# Patient Record
Sex: Female | Born: 1935 | Race: Black or African American | Hispanic: No | Smoking: Never smoker
Health system: Southern US, Community
[De-identification: ages and names within clinical notes are randomized; demographics above are authoritative.]

## PROBLEM LIST (undated history)

## (undated) DIAGNOSIS — H409 Unspecified glaucoma: Secondary | ICD-10-CM

## (undated) DIAGNOSIS — H269 Unspecified cataract: Secondary | ICD-10-CM

## (undated) DIAGNOSIS — F0390 Unspecified dementia without behavioral disturbance: Secondary | ICD-10-CM

## (undated) DIAGNOSIS — M1990 Unspecified osteoarthritis, unspecified site: Secondary | ICD-10-CM

## (undated) DIAGNOSIS — G5600 Carpal tunnel syndrome, unspecified upper limb: Secondary | ICD-10-CM

## (undated) DIAGNOSIS — I10 Essential (primary) hypertension: Secondary | ICD-10-CM

## (undated) DIAGNOSIS — M4800 Spinal stenosis, site unspecified: Secondary | ICD-10-CM

## (undated) DIAGNOSIS — F03A0 Unspecified dementia, mild, without behavioral disturbance, psychotic disturbance, mood disturbance, and anxiety: Secondary | ICD-10-CM

## (undated) DIAGNOSIS — E7800 Pure hypercholesterolemia, unspecified: Secondary | ICD-10-CM

## (undated) DIAGNOSIS — E0590 Thyrotoxicosis, unspecified without thyrotoxic crisis or storm: Secondary | ICD-10-CM

## (undated) DIAGNOSIS — Z87440 Personal history of urinary (tract) infections: Secondary | ICD-10-CM

## (undated) HISTORY — DX: Carpal tunnel syndrome, unspecified upper limb: G56.00

## (undated) HISTORY — DX: Unspecified dementia, mild, without behavioral disturbance, psychotic disturbance, mood disturbance, and anxiety: F03.A0

## (undated) HISTORY — DX: Unspecified osteoarthritis, unspecified site: M19.90

## (undated) HISTORY — DX: Unspecified dementia without behavioral disturbance: F03.90

## (undated) HISTORY — DX: Unspecified glaucoma: H40.9

## (undated) HISTORY — DX: Personal history of urinary (tract) infections: Z87.440

## (undated) HISTORY — DX: Thyrotoxicosis, unspecified without thyrotoxic crisis or storm: E05.90

## (undated) HISTORY — DX: Essential (primary) hypertension: I10

## (undated) HISTORY — DX: Unspecified cataract: H26.9

## (undated) HISTORY — DX: Spinal stenosis, site unspecified: M48.00

## (undated) HISTORY — PX: CARPAL TUNNEL RELEASE: SHX101

## (undated) HISTORY — DX: Pure hypercholesterolemia, unspecified: E78.00

---

## 1998-03-18 HISTORY — PX: CATARACT EXTRACTION: SUR2

## 2014-01-11 ENCOUNTER — Ambulatory Visit (INDEPENDENT_AMBULATORY_CARE_PROVIDER_SITE_OTHER): Payer: Federal, State, Local not specified - PPO | Admitting: Internal Medicine

## 2014-01-11 ENCOUNTER — Encounter: Payer: Self-pay | Admitting: Internal Medicine

## 2014-01-11 VITALS — BP 130/60 | HR 65 | Temp 97.6°F

## 2014-01-11 DIAGNOSIS — I739 Peripheral vascular disease, unspecified: Secondary | ICD-10-CM

## 2014-01-11 DIAGNOSIS — R8290 Unspecified abnormal findings in urine: Secondary | ICD-10-CM

## 2014-01-11 DIAGNOSIS — M179 Osteoarthritis of knee, unspecified: Secondary | ICD-10-CM

## 2014-01-11 DIAGNOSIS — N390 Urinary tract infection, site not specified: Secondary | ICD-10-CM

## 2014-01-11 DIAGNOSIS — R82998 Other abnormal findings in urine: Secondary | ICD-10-CM

## 2014-01-11 DIAGNOSIS — K5900 Constipation, unspecified: Secondary | ICD-10-CM

## 2014-01-11 DIAGNOSIS — E0590 Thyrotoxicosis, unspecified without thyrotoxic crisis or storm: Secondary | ICD-10-CM

## 2014-01-11 DIAGNOSIS — R6889 Other general symptoms and signs: Secondary | ICD-10-CM

## 2014-01-11 DIAGNOSIS — IMO0002 Reserved for concepts with insufficient information to code with codable children: Secondary | ICD-10-CM

## 2014-01-11 DIAGNOSIS — G4700 Insomnia, unspecified: Secondary | ICD-10-CM

## 2014-01-11 DIAGNOSIS — F0390 Unspecified dementia without behavioral disturbance: Secondary | ICD-10-CM

## 2014-01-11 DIAGNOSIS — H409 Unspecified glaucoma: Secondary | ICD-10-CM

## 2014-01-11 DIAGNOSIS — I10 Essential (primary) hypertension: Secondary | ICD-10-CM

## 2014-01-11 DIAGNOSIS — M1710 Unilateral primary osteoarthritis, unspecified knee: Secondary | ICD-10-CM

## 2014-01-11 DIAGNOSIS — E785 Hyperlipidemia, unspecified: Secondary | ICD-10-CM

## 2014-01-11 DIAGNOSIS — Z23 Encounter for immunization: Secondary | ICD-10-CM

## 2014-01-11 LAB — POCT URINALYSIS DIPSTICK
Bilirubin, UA: NEGATIVE
Glucose, UA: NEGATIVE
Ketones, UA: NEGATIVE
Nitrite, UA: POSITIVE
Protein, UA: 30
Spec Grav, UA: 1.015
Urobilinogen, UA: NEGATIVE
pH, UA: 6

## 2014-01-11 MED ORDER — SULFAMETHOXAZOLE-TMP DS 800-160 MG PO TABS: 1.0000 | ORAL_TABLET | Freq: Two times a day (BID) | ORAL | Status: DC

## 2014-01-11 NOTE — Progress Notes (Signed)
Patient ID: Melanie SilverCassie Parker, female   DOB: 07/14/1923, 80 y.o.   MRN: [id number]    Chief Complaint  Patient presents with  . Establish Care    New patient establish care   . Urinary Frequency    Patient c/o of urinary frequency x couple of weeks   . Immunizations    Discuss if ok for the flu shot   Allergies  Allergen Reactions  . Ivp Dye [Iodinated Diagnostic Agents] Anaphylaxis  . Naprosyn [Naproxen]    HPI 80 y/o female patient is here to establish care. [place] [facility] and has not seen a doctor since then. She lives with Werner LeanAngela Ivery, her caregiver.  She is legally blind for 2 years. She has history of HTN, dementia, hyperthyroidism, OA, insomnia and HLD among others. She was seeing Dr Lovell SheehanKristina Rowe prior to this. No prior records available for review. She has been having increased urinary frequency for few weeks with dysuria. She would like to know if it ok to get flu vaccine.   Review of Systems  Constitutional: Negative for fever, chills, malaise/fatigue and diaphoresis. Has cold intolerance  HENT: Negative for congestion, hearing loss and sore throat.   Eyes: is legally blind in left eye and has some vision in right eye, is s/p cataract surgery  Respiratory: Negative for cough, sputum production, shortness of breath and wheezing.   Cardiovascular: Negative for chest pain, palpitations, orthopnea. Has leg swelling at times  Gastrointestinal: Negative for heartburn, nausea, vomiting, abdominal pain, rectal bleed, diarrhea and constipation.  Genitourinary: has dysuria. Negative for hematuria. Denies back and flank pain  Musculoskeletal: Negative for back pain, falls, myalgias. has multiple joint pain. Left hand carpal unnel syndrome Skin: Negative for itching and rash.  Neurological: Negative for dizziness, tingling, focal weakness and headaches.  Psychiatric/Behavioral: Negative for depression and memory loss. The patient is not nervous/anxious.    Past Medical History  Diagnosis Date  . High blood pressure   . High cholesterol   . Overactive thyroid gland   . Cataract, right   . Mild dementia   . Arthritis   . Spinal stenosis   . Carpal tunnel syndrome   . History of recurrent UTI (urinary tract infection)    Past Surgical History  Procedure Laterality Date  . Carpal tunnel release    . Cataract extraction  2000   Medication reviewed. See MAR  Family History  Problem Relation Age of Onset  . Cancer Brother     Prostate   . Arthritis Mother   . Heart disease Mother   . Heart disease Father   . Lung disease Brother    History   Social History  . Marital Status: Widowed    Spouse Name: N/A    Number of Children: N/A  . Years of Education: N/A   Occupational History  . Not on file.   Social History Main Topics  . Smoking status: Never Smoker   . Smokeless tobacco: Current User    Types: Snuff  . Alcohol Use: No  . Drug Use: No  . Sexual Activity: Not on file   Other Topics Concern  . Not on file   Social History Narrative   Limits caffeine when possible   Lives in an apartment, 1 stories, 3 persons, no pets   Current/past profession- Designer, fashion/clothingederal civil service worker- [place]    No exercise       Physical exam BP 130/60 mmHg  Pulse 65  Temp(Src) 97.6 F (36.4 C) (Oral)  Wt   SpO2 99%  General- elderly female in no acute distress Head- atraumatic, normocephalic Eyes- no pallor, no icterus, no discharge Neck- no lymphadenopathy Cardiovascular- normal s1,s2, no murmurs Respiratory- bilateral clear to auscultation, no wheeze, no rhonchi, no crackles Abdomen- bowel sounds present, soft, non tender, no CVA tenderness Musculoskeletal- able to move all 4 extremities, right leg weakness and limited mobility, on wheelchair, no calf pain, no leg edema  Neurological- no focal deficit Skin- warm and dry Psychiatry- alert and oriented to person, place and time, normal mood and  affect  Assessment/plan  1. Hyperlipidemia Check lipid today. Continue lipitor for no - CMP - Lipid Panel  2. Essential hypertension Stable bp. Continue lisinopril and amlodipine current regimen - CMP  3. Glaucoma Continue her latanoprost.   4. Constipation, unspecified constipation type Continue colace  5. Osteoarthrosis, unspecified whether generalized or localized, involving lower leg Continue her mobic  6. Hyperthyroidism Continue methimazole to get prior records - CBC with Differential - TSH - T3 - T4, Free  7. Cold intolerance Check thyroid panel - TSH - T3 - T4, Free  8. Insomnia Continue her trazodone for now, no dose changes  9. Encounter for immunization Influenza vaccine provided  10. Abnormal urine odor - POC Urinalysis Dipstick - Culture, Urine; Future - Culture, Urine  11. Urine leukocytes Start empirically on bactrim ds bid for now, f/u urine culture report, encouraged hydration - POC Urinalysis Dipstick - Culture, Urine; Future - Culture, Urine

## 2014-01-12 LAB — CBC WITH DIFFERENTIAL/PLATELET
Basophils Absolute: 0.1 10*3/uL (ref 0.0–0.2)
Basos: 2 %
Eos: 2 %
Eosinophils Absolute: 0.2 10*3/uL (ref 0.0–0.4)
HCT: 39.7 % (ref 34.0–46.6)
Hemoglobin: 13.4 g/dL (ref 11.1–15.9)
Immature Grans (Abs): 0 10*3/uL (ref 0.0–0.1)
Immature Granulocytes: 0 %
Lymphocytes Absolute: 2.9 10*3/uL (ref 0.7–3.1)
Lymphs: 36 %
MCH: 29.1 pg (ref 26.6–33.0)
MCHC: 33.8 g/dL (ref 31.5–35.7)
MCV: 86 fL (ref 79–97)
MONOS ABS: 0.6 10*3/uL (ref 0.1–0.9)
Monocytes: 8 %
Neutrophils Absolute: 4.1 10*3/uL (ref 1.4–7.0)
Neutrophils Relative %: 52 %
RBC: 4.61 x10E6/uL (ref 3.77–5.28)
RDW: 14.6 % (ref 12.3–15.4)
WBC: 8 10*3/uL (ref 3.4–10.8)

## 2014-01-12 LAB — TSH: TSH: 1.93 u[IU]/mL (ref 0.450–4.500)

## 2014-01-12 LAB — COMPREHENSIVE METABOLIC PANEL
ALT: 13 IU/L (ref 0–32)
AST: 21 IU/L (ref 0–40)
Albumin/Globulin Ratio: 1.6 (ref 1.1–2.5)
Albumin: 4.2 g/dL (ref 3.5–4.7)
Alkaline Phosphatase: 83 IU/L (ref 39–117)
BILIRUBIN TOTAL: 0.5 mg/dL (ref 0.0–1.2)
BUN/Creatinine Ratio: 18 (ref 11–26)
BUN: 14 mg/dL (ref 8–27)
CO2: 24 mmol/L (ref 18–29)
CREATININE: 0.8 mg/dL (ref 0.57–1.00)
Calcium: 9.5 mg/dL (ref 8.7–10.3)
Chloride: 105 mmol/L (ref 97–108)
GFR calc non Af Amer: 66 mL/min/{1.73_m2} (ref >59)
GFR, EST AFRICAN AMERICAN: 76 mL/min/{1.73_m2} (ref >59)
GLOBULIN, TOTAL: 2.6 g/dL (ref 1.5–4.5)
Glucose: 95 mg/dL (ref 65–99)
POTASSIUM: 4 mmol/L (ref 3.5–5.2)
SODIUM: 145 mmol/L — AB (ref 134–144)
Total Protein: 6.8 g/dL (ref 6.0–8.5)

## 2014-01-12 LAB — T3: T3, Total: 117 ng/dL (ref 71–180)

## 2014-01-12 LAB — LIPID PANEL
Chol/HDL Ratio: 2.5 ratio units (ref 0.0–4.4)
Cholesterol, Total: 154 mg/dL (ref 100–199)
HDL: 61 mg/dL (ref >39)
LDL CALC: 80 mg/dL (ref 0–99)
Triglycerides: 66 mg/dL (ref 0–149)
VLDL CHOLESTEROL CAL: 13 mg/dL (ref 5–40)

## 2014-01-12 LAB — T4, FREE: Free T4: 1.25 ng/dL (ref 0.82–1.77)

## 2014-01-13 LAB — URINE CULTURE

## 2014-01-14 ENCOUNTER — Telehealth: Payer: Self-pay | Admitting: *Deleted

## 2014-01-14 NOTE — Telephone Encounter (Signed)
Spoke with the son regarding the patient's labs, he was informed to stop the antibiotics and to give her plenty of water because her sodium level was a little elevated. He stated that he would push the fluid.

## 2014-01-14 NOTE — Telephone Encounter (Signed)
Message copied by Lamont SnowballICE, Yentl Verge L on Fri Jan 14, 2014  4:17 PM ------      Message from: Oneal GroutPANDEY, MAHIMA      Created: Fri Jan 14, 2014  3:48 PM       Elevated sodium level- encourage hydration. Rest of the lab values normal.  No bacterial growth in urine sample. Can stop antibiotics. ------

## 2014-02-09 ENCOUNTER — Ambulatory Visit (INDEPENDENT_AMBULATORY_CARE_PROVIDER_SITE_OTHER): Payer: Federal, State, Local not specified - PPO | Admitting: Internal Medicine

## 2014-02-09 ENCOUNTER — Encounter: Payer: Self-pay | Admitting: Internal Medicine

## 2014-02-09 VITALS — BP 130/70 | HR 77 | Temp 98.2°F | Resp 18

## 2014-02-09 DIAGNOSIS — I10 Essential (primary) hypertension: Secondary | ICD-10-CM

## 2014-02-09 DIAGNOSIS — I739 Peripheral vascular disease, unspecified: Secondary | ICD-10-CM | POA: Insufficient documentation

## 2014-02-09 DIAGNOSIS — E0590 Thyrotoxicosis, unspecified without thyrotoxic crisis or storm: Secondary | ICD-10-CM

## 2014-02-09 DIAGNOSIS — R32 Unspecified urinary incontinence: Secondary | ICD-10-CM

## 2014-02-09 DIAGNOSIS — F0390 Unspecified dementia without behavioral disturbance: Secondary | ICD-10-CM | POA: Insufficient documentation

## 2014-02-09 MED ORDER — MIRABEGRON ER 25 MG PO TB24: 25.0000 mg | ORAL_TABLET | Freq: Every day | ORAL | Status: DC

## 2014-02-09 NOTE — Progress Notes (Signed)
Patient ID: Melanie Parker, female   DOB: 07/05/1923, 80 y.o.   MRN: [id number]    Chief Complaint  Patient presents with  . Acute Visit   Allergies  Allergen Reactions  . Ivp Dye [Iodinated Diagnostic Agents] Anaphylaxis  . Naprosyn [Naproxen]     HPI 80 y/o female pt is here for acute concern. She has been having trouble with her urination for sometime. She has constant leakage and this is bothering her. She also provides hx of recurrent UTI in past. She has discomfort with urination and feels stinging sensation in her groin area She is legally blind and has dementia. Her son Melanie Parker is her medical representative. He is here with her today Her old recrds are here today. Reviewed and sending necessary ones for scan. Seeing Dr Synetta ShadowHalligan from vein and vascular before, no intervention planned as per prior notes. Updated allergy list  Review of Systems  Constitutional: Negative for fever, chills, diaphoresis. has cold intolerance HENT: Negative for congestion, hearing loss and sore throat.   Eyes: legally blind  Respiratory: Negative for cough, sputum production, shortness of breath and wheezing.   Cardiovascular: Negative for chest pain, palpitations Musculoskeletal: Negative for back pain, on wheelchair Skin: Negative for itching and rash.  Neurological: Negative for dizziness, headaches.  Psychiatric/Behavioral: Negative for depression   Past Medical History  Diagnosis Date  . High blood pressure   . High cholesterol   . Overactive thyroid gland   . Cataract, right   . Mild dementia   . Arthritis   . Spinal stenosis   . Carpal tunnel syndrome   . History of recurrent UTI (urinary tract infection)    Current Outpatient Prescriptions on File Prior to Visit  Medication Sig Dispense Refill  . AMBULATORY NON FORMULARY MEDICATION Medication Name: [employer] Plant Powder, One table spoon q other day    . amLODipine (NORVASC) 10 MG tablet Take 10 mg by mouth daily. For blood  pressure    . atorvastatin (LIPITOR) 10 MG tablet Take 10 mg by mouth daily. For high  cholesterol    . docusate sodium (COLACE) 50 MG capsule Take 50 mg by mouth every other day. For constipation    . latanoprost (XALATAN) 0.005 % ophthalmic solution Place 1 drop into both eyes at bedtime.    Marland Kitchen. lisinopril (PRINIVIL,ZESTRIL) 10 MG tablet Take 10 mg by mouth daily. For blood pressure    . meloxicam (MOBIC) 7.5 MG tablet Take 7.5 mg by mouth daily.    . methimazole (TAPAZOLE) 5 MG tablet Take 5 mg by mouth. Once daily for 5 day's of the week for hyperthyroid    . Omega-3 Fatty Acids (FISH OIL PO) Take by mouth daily.    . traZODone (DESYREL) 50 MG tablet 1/2 by mouth at bedtime     No current facility-administered medications on file prior to visit.    Physical exam BP 130/70 mmHg  Pulse 77  Temp(Src) 98.2 F (36.8 C) (Oral)  Resp 18  SpO2 98%  Recheck 130/70  General- elderly female in no acute distress Head- atraumatic, normocephalic Neck- no lymphadenopathy Mouth- normal mucus membrane Cardiovascular- normal s1,s2, no murmurs Respiratory- bilateral clear to auscultation, no wheeze, no rhonchi, no crackles Abdomen- bowel sounds present, soft, non tender Pelvic exam- has dribbling of urine, no rawness noted but is covered in her urine, no skin breakdown noted Musculoskeletal-  no leg edema, on wheelchair, can stand with assistance but can't walk Neurological- no focal deficit Skin- warm and  dry Psychiatry- alert and oriented  imaging 07/16/11 mammogram benign mammographic findings  labs 09/30/13 u/a- yellow, cloudy, large blood, positive for nitrites, large leukocytes 05/18/13 urine culture proteus miralbilis resistant to cipro, nitrofurantoin, doxycycline  09/30/13 wbc 7.7, hb 13.9, hct 43, mcv 91, plt 169, a1c 5.9, glu 136, na 146, k 4.1, bun 25, cr 0.90, ca 9.5, alb 3.7, tsh 1.48, free t4 2.52  Assessment/plan  1. Urinary incontinence in female - Ambulatory referral to  Urology - Culture, Urine - Urinalysis  2. Essential hypertension Stable on repeat check. Continue amlodipine and lisinopril for now  3. Hyperthyroidism Continue methimazole, reviewed her thyroid panel. Pt would like to establish care with endocrinology - Ambulatory referral to Endocrinology

## 2014-02-10 LAB — URINALYSIS
Bilirubin, UA: NEGATIVE
Ketones, UA: NEGATIVE
Nitrite, UA: POSITIVE — AB
Specific Gravity, UA: 1.018 (ref 1.005–1.030)
Urobilinogen, Ur: 0.2 mg/dL (ref 0.2–1.0)
pH, UA: 8.5 — ABNORMAL HIGH (ref 5.0–7.5)

## 2014-02-11 LAB — URINE CULTURE

## 2014-02-15 ENCOUNTER — Other Ambulatory Visit: Payer: Self-pay | Admitting: *Deleted

## 2014-02-15 ENCOUNTER — Telehealth: Payer: Self-pay | Admitting: *Deleted

## 2014-02-15 MED ORDER — SACCHAROMYCES BOULARDII 250 MG PO CAPS: ORAL_CAPSULE | ORAL | Status: DC

## 2014-02-15 MED ORDER — SULFAMETHOXAZOLE-TRIMETHOPRIM 800-160 MG PO TABS: ORAL_TABLET | ORAL | Status: DC

## 2014-02-15 NOTE — Telephone Encounter (Signed)
-----   Message from Oneal GroutMahima Pandey, MD sent at 02/15/2014  1:23 PM EST ----- Your urine test grew bacteria suggesting of urinary tract infection. Start bactrim DS 1 tab every 12 hours for 7 days with florastor 250 mg bid x 2 weeks. Drink water

## 2014-02-15 NOTE — Telephone Encounter (Signed)
Called patient's son left message on his cell phone regarding medications that were sent to the pharmacy ( Bactrim and Landlorastor) [facility] on [address] Left message for him to call me back if he had any questions regarding this message.

## 2014-02-17 ENCOUNTER — Other Ambulatory Visit: Payer: Self-pay | Admitting: *Deleted

## 2014-02-17 ENCOUNTER — Telehealth: Payer: Self-pay | Admitting: *Deleted

## 2014-02-17 MED ORDER — OXYBUTYNIN CHLORIDE ER 5 MG PO TB24: 5.0000 mg | ORAL_TABLET | Freq: Every day | ORAL | Status: DC

## 2014-02-17 NOTE — Telephone Encounter (Signed)
-----   Message from Oneal GroutMahima Pandey, MD sent at 02/17/2014  2:26 PM EST ----- Regarding: RE: Mybetriq D/c myrbetriq and have her started on ditropan 5 mg daily for now.  ----- Message -----    From: Lamont SnowballSharon L Saamiya Jeppsen, RMA    Sent: 02/17/2014   1:09 PM      To: Oneal GroutMahima Pandey, MD Subject: Mybetriq                                       Spoke with patient's son regarding medication (Mybetriq), he states that the medication is to high in price for them and wants something cheaper if possible. I have investigated the prices of the other medications and Ditropan XL seems to be the cheapest amongst the other medications.

## 2014-02-17 NOTE — Telephone Encounter (Signed)
Talked with the son regarding the medication change , and to inform him that he could get a probiotic OTC that would be a little cheaper or they could use yogurt as well.

## 2014-03-14 ENCOUNTER — Other Ambulatory Visit: Payer: Self-pay | Admitting: *Deleted

## 2014-03-14 MED ORDER — METHIMAZOLE 5 MG PO TABS: 5.0000 mg | ORAL_TABLET | Freq: Every day | ORAL | Status: AC

## 2014-03-28 ENCOUNTER — Telehealth: Payer: Self-pay

## 2014-03-28 NOTE — Telephone Encounter (Signed)
Patient established care in October 2015, pending appointment for March 2016 for Complete Physical. Patient's son would like to know last pneumonia vaccine, [place] states this should be in her records from previous provider.

## 2014-03-30 NOTE — Telephone Encounter (Signed)
Melanie Parker was informed of pneumonia vaccine yesterday

## 2014-04-06 ENCOUNTER — Encounter: Payer: Federal, State, Local not specified - PPO | Admitting: Internal Medicine

## 2014-04-12 ENCOUNTER — Telehealth: Payer: Self-pay | Admitting: *Deleted

## 2014-04-12 NOTE — Telephone Encounter (Signed)
Patient son called and wanted to know the procedure of getting homehealth for patient. Told him that the patient would need a face to face to set up homehealth and he said he would just wait until patient's appointment and discuss with provider.

## 2014-05-31 ENCOUNTER — Encounter: Payer: Federal, State, Local not specified - PPO | Admitting: Internal Medicine

## 2014-06-03 ENCOUNTER — Encounter: Payer: Self-pay | Admitting: Internal Medicine

## 2014-06-03 ENCOUNTER — Ambulatory Visit (INDEPENDENT_AMBULATORY_CARE_PROVIDER_SITE_OTHER): Payer: Federal, State, Local not specified - PPO | Admitting: Internal Medicine

## 2014-06-03 VITALS — BP 138/78 | HR 56 | Temp 97.9°F | Resp 20

## 2014-06-03 DIAGNOSIS — Z0000 Encounter for general adult medical examination without abnormal findings: Secondary | ICD-10-CM

## 2014-06-03 DIAGNOSIS — F0390 Unspecified dementia without behavioral disturbance: Secondary | ICD-10-CM

## 2014-06-03 DIAGNOSIS — F4321 Adjustment disorder with depressed mood: Secondary | ICD-10-CM

## 2014-06-03 DIAGNOSIS — B372 Candidiasis of skin and nail: Secondary | ICD-10-CM

## 2014-06-03 DIAGNOSIS — I10 Essential (primary) hypertension: Secondary | ICD-10-CM

## 2014-06-03 DIAGNOSIS — E0590 Thyrotoxicosis, unspecified without thyrotoxic crisis or storm: Secondary | ICD-10-CM

## 2014-06-03 DIAGNOSIS — H548 Legal blindness, as defined in USA: Secondary | ICD-10-CM | POA: Diagnosis not present

## 2014-06-03 DIAGNOSIS — E785 Hyperlipidemia, unspecified: Secondary | ICD-10-CM | POA: Diagnosis not present

## 2014-06-03 DIAGNOSIS — H409 Unspecified glaucoma: Secondary | ICD-10-CM | POA: Diagnosis not present

## 2014-06-03 DIAGNOSIS — R32 Unspecified urinary incontinence: Secondary | ICD-10-CM

## 2014-06-03 DIAGNOSIS — I739 Peripheral vascular disease, unspecified: Secondary | ICD-10-CM | POA: Diagnosis not present

## 2014-06-03 MED ORDER — NYSTATIN 100000 UNIT/GM EX POWD: CUTANEOUS | Status: DC

## 2014-06-03 NOTE — Progress Notes (Signed)
Patient ID: Melanie Parker, female   DOB: October 27, 1923, 81 y.o.   MRN: [id number]    Facility  [facility]    Place of Service:   OFFICE   Allergies  Allergen Reactions  . Ivp Dye [Iodinated Diagnostic Agents] Anaphylaxis  . Naprosyn [Naproxen]     Chief Complaint  Patient presents with  . Annual Exam    Yearly check-up, EKG,   . Depression    Patient c/o of slight depression due to recent move    HPI:  81 yo female seen today for comprehensive exam. She is legally blind and now requires total care. Her  eye specialist is managing glaucoma. Needs HH with home care aid a few times a week. She was receiving care through social services prior to relocating from the [place] last August. She lives with a caregiver across the road from her son.  Her right ear feels stopped up and she would like it checked out. She has a hx seasonal allergy.  She has recurrent UTIs and sees urology. She was dx with OAB.  She has hyperthyroidism and takes tapazole. She reports "thyroid chills".  She has insomnia and takes 1/2 tab of trazodone. Med really does not help and she ends up napping during the day.    Past Medical History  Diagnosis Date  . High blood pressure   . High cholesterol   . Overactive thyroid gland   . Cataract, right   . Mild dementia   . Arthritis   . Spinal stenosis   . Carpal tunnel syndrome   . History of recurrent UTI (urinary tract infection)   . Glaucoma    Past Surgical History  Procedure Laterality Date  . Carpal tunnel release    . Cataract extraction  2000   Family History  Problem Relation Age of Onset  . Cancer Brother     Prostate   . Arthritis Mother   . Heart disease Mother   . Heart disease Father   . Lung disease Brother    History   Social History  . Marital Status: Widowed    Spouse Name: N/A  . Number of Children: N/A  . Years of Education: N/A   Social History Main Topics  . Smoking status: Never Smoker   . Smokeless tobacco: Current  User    Types: Snuff  . Alcohol Use: No  . Drug Use: No  . Sexual Activity: Not on file   Other Topics Concern  . 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 For blood pressure   ATORVASTATIN (LIPITOR) 10 MG TABLET    Take 10 mg by mouth daily. For high  cholesterol   DOCUSATE SODIUM (COLACE) 50 MG CAPSULE    Take 50 mg by mouth every other day. For constipation   LATANOPROST (XALATAN) 0.005 % OPHTHALMIC SOLUTION    Place 1 drop into both eyes at bedtime.   LISINOPRIL (PRINIVIL,ZESTRIL) 10 MG TABLET    Take 10 mg by mouth daily. For blood pressure   MELOXICAM (  MOBIC) 7.5 MG TABLET    Take 7.5 mg by mouth daily.   METHIMAZOLE (TAPAZOLE) 5 MG TABLET    Take 1 tablet (5 mg total) by mouth daily. For hyperthyroidism   TRAZODONE (DESYREL) 50 MG TABLET    1/2 by mouth at bedtime  Modified Medications   No medications on file  Discontinued Medications   AMBULATORY NON FORMULARY MEDICATION    Medication Name: Nutritional therapist, One table spoon q other day   OMEGA-3 FATTY ACIDS (FISH OIL PO)    Take by mouth daily.   OXYBUTYNIN (DITROPAN XL) 5 MG 24 HR TABLET    Take 1 tablet (5 mg total) by mouth at bedtime.   SACCHAROMYCES BOULARDII (FLORASTOR) 250 MG CAPSULE    Take 1 capsule twice daily for 2-weeks.   SULFAMETHOXAZOLE-TRIMETHOPRIM (BACTRIM DS,SEPTRA DS) 800-160 MG PER TABLET    Take 1 tablet every 12 hours for 7 days.     Review of Systems  Unable to perform ROS: Dementia    Filed Vitals:   06/03/14 1002  BP: 138/78  Pulse: 56  Temp: 97.9 F (36.6 C)  TempSrc: Oral  Resp: 20    SpO2: 99%   Cannot calculate BMI with a height equal to zero.  Physical Exam  Constitutional: She appears well-developed and well-nourished. No distress.  Son present. Pt is awake and alert. Using w/c to ambulate  HENT:  Mouth/Throat: Oropharynx is clear and moist. No oropharyngeal exudate.  left cerumen impaction and unable to visualize TM or canal  Eyes: Pupils are equal, round, and reactive to light. Right eye exhibits no discharge. Left eye exhibits no discharge. No scleral icterus.  Unable to assess EOM due to sight impairment  Neck: Neck supple. No tracheal deviation present. No thyromegaly present.  Cardiovascular: Normal rate and regular rhythm.  Exam reveals decreased pulses. Exam reveals no gallop and no friction rub.   Murmur (1/6 SEM) heard. Pulses:      Carotid pulses are 2+ on the right side, and 2+ on the left side.      Dorsalis pedis pulses are 1+ on the right side, and 2+ on the left side.       Posterior tibial pulses are 1+ on the right side, and 2+ on the left side.  No LE edema b/l. no calf TTP. No carotid bruit b/l  Pulmonary/Chest: Effort normal and breath sounds normal. No stridor. No respiratory distress. She has no wheezes. She has no rales. Right breast exhibits no inverted nipple, no mass, no nipple discharge, no skin change and no tenderness. Left breast exhibits no inverted nipple, no mass, no nipple discharge, no skin change and no tenderness. Breasts are symmetrical.  Abdominal: Soft. Bowel sounds are normal. She exhibits no distension and no mass. There is no tenderness. There is no rebound and no guarding.  Musculoskeletal: She exhibits edema and tenderness.  Lymphadenopathy:    She has no cervical adenopathy.  Neurological: She is alert. She has normal reflexes.  Skin: Skin is warm and dry. Rash (red angry appearing dry rash under right breast with macerated area under left breast; no vesicular formation) noted.  B/l toenail dystrophy with dry scaling  plantar feet  Psychiatric: She has a normal mood and affect. Her behavior is normal.     Labs reviewed: No visits with results within 3 Month(s) from this visit. Latest known visit with results is:  Office Visit on 02/09/2014  Component Date Value Ref Range Status  . Urine Culture,  Routine 02/09/2014 Final report*  Final  . Result 1 02/09/2014 Proteus mirabilis*  Final   Greater than 100,000 colony forming units per mL  . ANTIMICROBIAL SUSCEPTIBILITY 02/09/2014 Comment   Final   Comment:       ** S = Susceptible; I = Intermediate; R = Resistant **                    P = Positive; N = Negative             MICS are expressed in micrograms per mL    Antibiotic                 RSLT#1    RSLT#2    RSLT#3    RSLT#4 Amoxicillin/Clavulanic Acid    S Ampicillin                     S Cefepime                       S Ceftriaxone                    S Cefuroxime                     S Cephalothin                    S Ciprofloxacin                  R Ertapenem                      S Gentamicin                     S Levofloxacin                   R Nitrofurantoin                 R Piperacillin                   S Tetracycline                   R Tobramycin                     S Trimethoprim/Sulfa             S   . Specific Gravity, UA 02/09/2014 1.018  1.005 - 1.030 Final  . pH, UA 02/09/2014 8.5* 5.0 - 7.5 Final  . Color, UA 02/09/2014 Yellow  Yellow Final  . Appearance Ur 02/09/2014 Turbid* Clear Final  . Leukocytes, UA 02/09/2014 3+* Negative Final  . Protein, UA 02/09/2014 2+* Negative/Trace Final  . Glucose, UA 02/09/2014 Trace* Negative Final  . Ketones, UA 02/09/2014 Negative  Negative Final  . RBC, UA 02/09/2014 2+* Negative Final  . Bilirubin, UA 02/09/2014 Negative  Negative Final  . Urobilinogen, Ur 02/09/2014 0.2  0.2 - 1.0 mg/dL Final  . Nitrite, UA 16/12/9602 Positive* Negative Final   ECG reviewed by myself- NSR @ 60bpm, nml axis, LAE, NSST changes. No acute ischemic  changes . No other ECG available to compare  Assessment/Plan    ICD-9-CM ICD-10-CM   1. Encounter for annual physical exam V70.0 Z00.00 EKG 12-Lead  2. Yeast dermatitis 112.3 B37.2 nystatin (MYCOSTATIN/NYSTOP) 100000 UNIT/GM POWD  3. PAD (peripheral artery disease) - currently taking  no medication 443.9 I73.9 Ambulatory referral to Vascular Surgery     Ambulatory referral to Home Health  4. Situational depression uncontrolled on trazodone 309.0 F43.21   5. Hyperthyroidism - on tapazole 242.90 E05.90 T4, Free     TSH     CBC with Differential     EKG 12-Lead  6. Essential hypertension - stable  On lisinopril and amlodipine 401.9 I10 CMP     Urinalysis with Reflex Microscopic     CBC with Differential     EKG 12-Lead  7. Glaucoma 365.9 H40.9 Ambulatory referral to Home Health   with legal blindness  8. Dementia, without behavioral disturbance stable; 294.20 F03.90 Ambulatory referral to Home Health   mild-moderate  9. Hyperlipidemia - on lipitor 272.4 E78.5 Lipid Panel     EKG 12-Lead  10. Urinary incontinence in female - off ditropan due to ADR 788.30 R32   11. Legally blind due to glaucoma 369.4 H54.8 Ambulatory referral to Home Health  12.    left cerumen impaction with hx seasonal allergy   --discussed vaccinations, diet and exercise. Emphasized the importantance to maintain healthy lifestyle. She reports her last mammogram was in Dec 2015. Her health maintenance is UTD. She has aged out of colonoscopy and declines Tdap.  --f/u with eye specialist as scheduled  --referrals as above  --labs as ordered  --increase trazodone to 1 tab qhs to improve depression and insomnia  --keep legs elevated when seated to reduce pain  --RTO in 1 month to reassess depression  --PROCEDURE: left ear cerumen removal with irrigation followed by use of ear forceps. Pt tolerated procedure well. Min bleeding in external ear canal. TM is dull but intact with no redness. She will apply cotton ball  to ear to prevent bleeding.   Leanah Kolander S. Ancil Linseyarter, D. O., F. A. C. O. I.  [facility] [address] [place] [address] [phone] Office (Wednesdays and Fridays 8 AM - 5 PM) [phone] Cell (Monday-Friday 8 AM - 5 PM)

## 2014-06-03 NOTE — Patient Instructions (Signed)
Increase trazodone to 1 tab at night to help sleep and improve depression  Continue other medications as ordered  Keep legs elevated when seated  Follow up in 1 mo to reassess depression

## 2014-06-04 LAB — CBC WITH DIFFERENTIAL/PLATELET
BASOS ABS: 0.1 10*3/uL (ref 0.0–0.2)
BASOS: 1 %
EOS ABS: 0.3 10*3/uL (ref 0.0–0.4)
EOS: 4 %
HEMATOCRIT: 43.9 % (ref 34.0–46.6)
Hemoglobin: 14.3 g/dL (ref 11.1–15.9)
Immature Grans (Abs): 0 10*3/uL (ref 0.0–0.1)
Immature Granulocytes: 0 %
Lymphocytes Absolute: 2.6 10*3/uL (ref 0.7–3.1)
Lymphs: 39 %
MCH: 29.1 pg (ref 26.6–33.0)
MCHC: 32.6 g/dL (ref 31.5–35.7)
MCV: 89 fL (ref 79–97)
MONOCYTES: 7 %
Monocytes Absolute: 0.4 10*3/uL (ref 0.1–0.9)
Neutrophils Absolute: 3.4 10*3/uL (ref 1.4–7.0)
Neutrophils Relative %: 49 %
Platelets: 217 10*3/uL (ref 150–379)
RBC: 4.91 x10E6/uL (ref 3.77–5.28)
RDW: 13.9 % (ref 12.3–15.4)
WBC: 6.8 10*3/uL (ref 3.4–10.8)

## 2014-06-04 LAB — COMPREHENSIVE METABOLIC PANEL
A/G RATIO: 1.4 (ref 1.1–2.5)
ALT: 11 IU/L (ref 0–32)
AST: 19 IU/L (ref 0–40)
Albumin: 4.2 g/dL (ref 3.5–4.7)
Alkaline Phosphatase: 96 IU/L (ref 39–117)
BILIRUBIN TOTAL: 0.5 mg/dL (ref 0.0–1.2)
BUN/Creatinine Ratio: 20 (ref 11–26)
BUN: 18 mg/dL (ref 8–27)
CO2: 22 mmol/L (ref 18–29)
CREATININE: 0.9 mg/dL (ref 0.57–1.00)
Calcium: 9.8 mg/dL (ref 8.7–10.3)
Chloride: 107 mmol/L (ref 97–108)
GFR calc Af Amer: 66 mL/min/{1.73_m2} (ref >59)
GFR calc non Af Amer: 57 mL/min/{1.73_m2} — ABNORMAL LOW (ref >59)
Globulin, Total: 3 g/dL (ref 1.5–4.5)
Glucose: 108 mg/dL — ABNORMAL HIGH (ref 65–99)
Potassium: 4 mmol/L (ref 3.5–5.2)
SODIUM: 146 mmol/L — AB (ref 134–144)
Total Protein: 7.2 g/dL (ref 6.0–8.5)

## 2014-06-04 LAB — URINALYSIS, ROUTINE W REFLEX MICROSCOPIC
Bilirubin, UA: NEGATIVE
Glucose, UA: NEGATIVE
KETONES UA: NEGATIVE
Nitrite, UA: POSITIVE — AB
Specific Gravity, UA: 1.015 (ref 1.005–1.030)
UUROB: 0.2 mg/dL (ref 0.2–1.0)
pH, UA: 6 (ref 5.0–7.5)

## 2014-06-04 LAB — T4, FREE: Free T4: 1.26 ng/dL (ref 0.82–1.77)

## 2014-06-04 LAB — LIPID PANEL
CHOL/HDL RATIO: 3.1 ratio (ref 0.0–4.4)
Cholesterol, Total: 154 mg/dL (ref 100–199)
HDL: 49 mg/dL (ref >39)
LDL Calculated: 84 mg/dL (ref 0–99)
Triglycerides: 106 mg/dL (ref 0–149)
VLDL Cholesterol Cal: 21 mg/dL (ref 5–40)

## 2014-06-04 LAB — MICROSCOPIC EXAMINATION
Casts: NONE SEEN /lpf
RBC, UA: >30 /hpf — AB (ref 0–?)

## 2014-06-04 LAB — TSH: TSH: 1.53 u[IU]/mL (ref 0.450–4.500)

## 2014-06-06 LAB — SPECIMEN STATUS REPORT

## 2014-06-08 ENCOUNTER — Telehealth: Payer: Self-pay

## 2014-06-08 MED ORDER — SACCHAROMYCES BOULARDII 250 MG PO CAPS: 250.0000 mg | ORAL_CAPSULE | Freq: Two times a day (BID) | ORAL | Status: DC

## 2014-06-08 MED ORDER — SULFAMETHOXAZOLE-TRIMETHOPRIM 800-160 MG PO TABS: 1.0000 | ORAL_TABLET | Freq: Two times a day (BID) | ORAL | Status: DC

## 2014-06-08 NOTE — Telephone Encounter (Signed)
[place], [place] verbalized understanding of results. RX's sent to pharmacy

## 2014-06-08 NOTE — Telephone Encounter (Signed)
-----   Message from MoniMytonca Carter, [place]DO sent at 06/07/2014  4:54 PM EDT ----- Will tx empircally for UTI- Rx bactrim DS #14 take 1 po BID x 7 days no RF; Florastor BID #20 no RF; push fluids

## 2014-06-09 DIAGNOSIS — H548 Legal blindness, as defined in USA: Secondary | ICD-10-CM | POA: Diagnosis not present

## 2014-06-09 DIAGNOSIS — F0390 Unspecified dementia without behavioral disturbance: Secondary | ICD-10-CM | POA: Diagnosis not present

## 2014-06-09 DIAGNOSIS — E0590 Thyrotoxicosis, unspecified without thyrotoxic crisis or storm: Secondary | ICD-10-CM | POA: Diagnosis not present

## 2014-06-09 DIAGNOSIS — I10 Essential (primary) hypertension: Secondary | ICD-10-CM | POA: Diagnosis not present

## 2014-06-09 DIAGNOSIS — H409 Unspecified glaucoma: Secondary | ICD-10-CM | POA: Diagnosis not present

## 2014-06-09 DIAGNOSIS — I739 Peripheral vascular disease, unspecified: Secondary | ICD-10-CM | POA: Diagnosis not present

## 2014-06-13 ENCOUNTER — Other Ambulatory Visit: Payer: Self-pay | Admitting: *Deleted

## 2014-06-13 DIAGNOSIS — I739 Peripheral vascular disease, unspecified: Secondary | ICD-10-CM

## 2014-06-15 DIAGNOSIS — F0390 Unspecified dementia without behavioral disturbance: Secondary | ICD-10-CM | POA: Diagnosis not present

## 2014-06-15 DIAGNOSIS — E0590 Thyrotoxicosis, unspecified without thyrotoxic crisis or storm: Secondary | ICD-10-CM | POA: Diagnosis not present

## 2014-06-15 DIAGNOSIS — I739 Peripheral vascular disease, unspecified: Secondary | ICD-10-CM | POA: Diagnosis not present

## 2014-06-15 DIAGNOSIS — H548 Legal blindness, as defined in USA: Secondary | ICD-10-CM | POA: Diagnosis not present

## 2014-06-15 DIAGNOSIS — I10 Essential (primary) hypertension: Secondary | ICD-10-CM | POA: Diagnosis not present

## 2014-06-15 DIAGNOSIS — H409 Unspecified glaucoma: Secondary | ICD-10-CM | POA: Diagnosis not present

## 2014-06-17 DIAGNOSIS — H409 Unspecified glaucoma: Secondary | ICD-10-CM | POA: Diagnosis not present

## 2014-06-17 DIAGNOSIS — I739 Peripheral vascular disease, unspecified: Secondary | ICD-10-CM | POA: Diagnosis not present

## 2014-06-17 DIAGNOSIS — E0590 Thyrotoxicosis, unspecified without thyrotoxic crisis or storm: Secondary | ICD-10-CM | POA: Diagnosis not present

## 2014-06-17 DIAGNOSIS — I10 Essential (primary) hypertension: Secondary | ICD-10-CM | POA: Diagnosis not present

## 2014-06-17 DIAGNOSIS — H548 Legal blindness, as defined in USA: Secondary | ICD-10-CM | POA: Diagnosis not present

## 2014-06-17 DIAGNOSIS — F0390 Unspecified dementia without behavioral disturbance: Secondary | ICD-10-CM | POA: Diagnosis not present

## 2014-06-20 DIAGNOSIS — H409 Unspecified glaucoma: Secondary | ICD-10-CM | POA: Diagnosis not present

## 2014-06-20 DIAGNOSIS — H548 Legal blindness, as defined in USA: Secondary | ICD-10-CM | POA: Diagnosis not present

## 2014-06-20 DIAGNOSIS — I739 Peripheral vascular disease, unspecified: Secondary | ICD-10-CM | POA: Diagnosis not present

## 2014-06-20 DIAGNOSIS — E0590 Thyrotoxicosis, unspecified without thyrotoxic crisis or storm: Secondary | ICD-10-CM | POA: Diagnosis not present

## 2014-06-20 DIAGNOSIS — F0390 Unspecified dementia without behavioral disturbance: Secondary | ICD-10-CM | POA: Diagnosis not present

## 2014-06-20 DIAGNOSIS — I10 Essential (primary) hypertension: Secondary | ICD-10-CM | POA: Diagnosis not present

## 2014-06-22 DIAGNOSIS — H409 Unspecified glaucoma: Secondary | ICD-10-CM | POA: Diagnosis not present

## 2014-06-22 DIAGNOSIS — I10 Essential (primary) hypertension: Secondary | ICD-10-CM | POA: Diagnosis not present

## 2014-06-22 DIAGNOSIS — F0390 Unspecified dementia without behavioral disturbance: Secondary | ICD-10-CM | POA: Diagnosis not present

## 2014-06-22 DIAGNOSIS — E0590 Thyrotoxicosis, unspecified without thyrotoxic crisis or storm: Secondary | ICD-10-CM | POA: Diagnosis not present

## 2014-06-22 DIAGNOSIS — I739 Peripheral vascular disease, unspecified: Secondary | ICD-10-CM | POA: Diagnosis not present

## 2014-06-22 DIAGNOSIS — H548 Legal blindness, as defined in USA: Secondary | ICD-10-CM | POA: Diagnosis not present

## 2014-06-27 DIAGNOSIS — H409 Unspecified glaucoma: Secondary | ICD-10-CM | POA: Diagnosis not present

## 2014-06-27 DIAGNOSIS — E0590 Thyrotoxicosis, unspecified without thyrotoxic crisis or storm: Secondary | ICD-10-CM | POA: Diagnosis not present

## 2014-06-27 DIAGNOSIS — H548 Legal blindness, as defined in USA: Secondary | ICD-10-CM | POA: Diagnosis not present

## 2014-06-27 DIAGNOSIS — I739 Peripheral vascular disease, unspecified: Secondary | ICD-10-CM | POA: Diagnosis not present

## 2014-06-27 DIAGNOSIS — F0390 Unspecified dementia without behavioral disturbance: Secondary | ICD-10-CM | POA: Diagnosis not present

## 2014-06-27 DIAGNOSIS — I10 Essential (primary) hypertension: Secondary | ICD-10-CM | POA: Diagnosis not present

## 2014-06-29 DIAGNOSIS — I739 Peripheral vascular disease, unspecified: Secondary | ICD-10-CM | POA: Diagnosis not present

## 2014-06-29 DIAGNOSIS — H548 Legal blindness, as defined in USA: Secondary | ICD-10-CM | POA: Diagnosis not present

## 2014-06-29 DIAGNOSIS — I10 Essential (primary) hypertension: Secondary | ICD-10-CM | POA: Diagnosis not present

## 2014-06-29 DIAGNOSIS — E0590 Thyrotoxicosis, unspecified without thyrotoxic crisis or storm: Secondary | ICD-10-CM | POA: Diagnosis not present

## 2014-06-29 DIAGNOSIS — F0390 Unspecified dementia without behavioral disturbance: Secondary | ICD-10-CM | POA: Diagnosis not present

## 2014-06-29 DIAGNOSIS — H409 Unspecified glaucoma: Secondary | ICD-10-CM | POA: Diagnosis not present

## 2014-06-30 ENCOUNTER — Telehealth: Payer: Self-pay | Admitting: *Deleted

## 2014-06-30 NOTE — Telephone Encounter (Signed)
Melanie Parker with [facility] called and stated that they need an order for a Child psychotherapistsocial worker. Stated that patient's home is infested with bed bugs. She stated that it is so bad that you can see the bugs crawling on the patient. She stated that they had to take the home health nurse to the Urgent Care due to multiple bed bug bites from the home. Verbal Order for Child psychotherapistocial Worker given.

## 2014-07-01 ENCOUNTER — Telehealth: Payer: Self-pay

## 2014-07-01 DIAGNOSIS — H548 Legal blindness, as defined in USA: Secondary | ICD-10-CM | POA: Diagnosis not present

## 2014-07-01 DIAGNOSIS — I10 Essential (primary) hypertension: Secondary | ICD-10-CM | POA: Diagnosis not present

## 2014-07-01 DIAGNOSIS — E0590 Thyrotoxicosis, unspecified without thyrotoxic crisis or storm: Secondary | ICD-10-CM | POA: Diagnosis not present

## 2014-07-01 DIAGNOSIS — H409 Unspecified glaucoma: Secondary | ICD-10-CM | POA: Diagnosis not present

## 2014-07-01 DIAGNOSIS — F0390 Unspecified dementia without behavioral disturbance: Secondary | ICD-10-CM | POA: Diagnosis not present

## 2014-07-01 DIAGNOSIS — I739 Peripheral vascular disease, unspecified: Secondary | ICD-10-CM | POA: Diagnosis not present

## 2014-07-01 NOTE — Telephone Encounter (Signed)
Millie Social worker with [facility] came to patient house, infested with bed bugs. She would like verbal order for her to go in two more days to help patient to move to a better "clean" place. Gave her the ok, she will fax over the order.

## 2014-07-05 ENCOUNTER — Telehealth: Payer: Self-pay | Admitting: *Deleted

## 2014-07-05 DIAGNOSIS — H548 Legal blindness, as defined in USA: Secondary | ICD-10-CM | POA: Diagnosis not present

## 2014-07-05 DIAGNOSIS — I10 Essential (primary) hypertension: Secondary | ICD-10-CM | POA: Diagnosis not present

## 2014-07-05 DIAGNOSIS — I739 Peripheral vascular disease, unspecified: Secondary | ICD-10-CM | POA: Diagnosis not present

## 2014-07-05 DIAGNOSIS — H409 Unspecified glaucoma: Secondary | ICD-10-CM | POA: Diagnosis not present

## 2014-07-05 DIAGNOSIS — F0390 Unspecified dementia without behavioral disturbance: Secondary | ICD-10-CM | POA: Diagnosis not present

## 2014-07-05 DIAGNOSIS — E0590 Thyrotoxicosis, unspecified without thyrotoxic crisis or storm: Secondary | ICD-10-CM | POA: Diagnosis not present

## 2014-07-05 NOTE — Telephone Encounter (Signed)
BJ with [facility] called and stated that patient is in a Bed Bug infested home. She stated that is is so infested that the bugs are on the outside of the home and in the grass. The son was advised to get the patient out of the home, but has not. Nurse stated that if patient is in the [facility], they will be calling A[facility]. All visits are on hold except this last visit and if patient gets moved then they will continue services and if not then they will discharge patient.

## 2014-07-05 NOTE — Telephone Encounter (Signed)
Noted  

## 2014-07-15 ENCOUNTER — Encounter: Payer: Self-pay | Admitting: Internal Medicine

## 2014-07-15 ENCOUNTER — Ambulatory Visit (INDEPENDENT_AMBULATORY_CARE_PROVIDER_SITE_OTHER): Payer: Federal, State, Local not specified - PPO | Admitting: Internal Medicine

## 2014-07-15 VITALS — BP 130/72 | HR 64 | Temp 98.2°F | Resp 20 | Ht 64.0 in

## 2014-07-15 DIAGNOSIS — F0390 Unspecified dementia without behavioral disturbance: Secondary | ICD-10-CM

## 2014-07-15 DIAGNOSIS — M150 Primary generalized (osteo)arthritis: Secondary | ICD-10-CM

## 2014-07-15 DIAGNOSIS — M159 Polyosteoarthritis, unspecified: Secondary | ICD-10-CM | POA: Insufficient documentation

## 2014-07-15 DIAGNOSIS — F4321 Adjustment disorder with depressed mood: Secondary | ICD-10-CM

## 2014-07-15 DIAGNOSIS — M25519 Pain in unspecified shoulder: Secondary | ICD-10-CM

## 2014-07-15 DIAGNOSIS — G4700 Insomnia, unspecified: Secondary | ICD-10-CM

## 2014-07-15 MED ORDER — TRAZODONE HCL 50 MG PO TABS: 50.0000 mg | ORAL_TABLET | Freq: Every day | ORAL | Status: DC

## 2014-07-15 MED ORDER — MELOXICAM 15 MG PO TABS: 15.0000 mg | ORAL_TABLET | Freq: Every day | ORAL | Status: AC

## 2014-07-15 NOTE — Patient Instructions (Addendum)
Increase trazodone to 1 tablet at bedtime to help with sleep and mood  Try melatonin 3mg  OTC at bedtime to help sleep  Continue other medications as ordered  Follow up in 3 mos. Keep appointment with vascular surgery as scheduled  No further mammograms needed

## 2014-07-15 NOTE — Progress Notes (Signed)
Patient ID: Melanie Parker, female   DOB: 04/05/1923, 81 y.o.   MRN: [id number]    Location:    [facility]   Place of Service:   OFFICE   Chief Complaint  Patient presents with  . Medical Management of Chronic Issues    1 month follow-up, Discuss labs (copy printed )    HPI:  81 yo female seen today for f/u depression and insomnia. She has difficulty sleeping at night. She also has right hand issues due to CTS and has had sx in the past to correct it. Pain is currently radiating into elbow and shoulder.   Trazodone not helping with taking a whole tablet. She still has depression but has a living situation and [facility] refuses to go back out until situation is corrected. There is a bed bug infestation in the apt complex so severe until that they are out in the grass.  Past Medical History  Diagnosis Date  . High blood pressure   . High cholesterol   . Overactive thyroid gland   . Cataract, right   . Mild dementia   . Arthritis   . Spinal stenosis   . Carpal tunnel syndrome   . History of recurrent UTI (urinary tract infection)   . Glaucoma     Past Surgical History  Procedure Laterality Date  . Carpal tunnel release    . Cataract extraction  2000    Patient Care Team: Oneal GroutMahima Pandey, MD as PCP - General (Internal Medicine)  History   Social History  . Marital Status: Widowed    Spouse Name: N/A  . Number of Children: N/A  . Years of Education: N/A   Occupational History  . Not on file.   Social History Main Topics  . Smoking status: Never Smoker   . Smokeless tobacco: Current User    Types: Snuff  . Alcohol Use: No  . Drug Use: No  . Sexual Activity: Not on file   Other Topics Concern  . 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 Her  smokeless tobacco use includes Snuff. She reports that she does not drink alcohol or use illicit drugs.  Allergies  Allergen Reactions  . Ivp Dye [Iodinated Diagnostic Agents] Anaphylaxis  . Naprosyn [Naproxen]     Medications: Patient's Medications  New Prescriptions   No medications on file  Previous Medications   AMLODIPINE (NORVASC) 10 MG TABLET    Take 10 mg by mouth daily. For blood pressure   ATORVASTATIN (LIPITOR) 10 MG TABLET    Take 10 mg by mouth daily. For high  cholesterol   BRIMONIDINE (ALPHAGAN) 0.15 % OPHTHALMIC SOLUTION    Place 1 drop into the right eye 3 (three) times daily.    DOCUSATE SODIUM (COLACE) 50 MG CAPSULE    Take 50 mg by mouth every other day. For constipation   DORZOLAMIDE HCL-TIMOLOL MAL OP    Apply to eye. adminster 1 drops in right eye 2 x daily   LATANOPROST (XALATAN) 0.005 % OPHTHALMIC SOLUTION    Place 1 drop into both eyes at bedtime.   LISINOPRIL (PRINIVIL,ZESTRIL) 10 MG TABLET    Take 10 mg by mouth daily. For blood pressure   MELOXICAM (MOBIC)  7.5 MG TABLET    Take 7.5 mg by mouth daily.   METHIMAZOLE (TAPAZOLE) 5 MG TABLET    Take 1 tablet (5 mg total) by mouth daily. For hyperthyroidism   NYSTATIN (MYCOSTATIN/NYSTOP) 100000 UNIT/GM POWD    Apply as directed x 1 week   TRAZODONE (DESYREL) 50 MG TABLET    1/2 by mouth at bedtime  Modified Medications   No medications on file  Discontinued Medications   SACCHAROMYCES BOULARDII (FLORASTOR) 250 MG CAPSULE    Take 1 capsule (250 mg total) by mouth 2 (two) times daily.   SULFAMETHOXAZOLE-TRIMETHOPRIM (BACTRIM DS,SEPTRA DS) 800-160 MG PER TABLET    Take 1 tablet by mouth 2 (two) times daily.    Review of Systems  Unable to perform ROS: Dementia    Filed Vitals:   07/15/14 0902  BP: 130/72  Pulse: 64  Temp: 98.2 F (36.8 C)  TempSrc: Oral  Resp: 20  Height: 5\' 4"  (1.626 m)  SpO2: 98%   There is no weight on file to calculate BMI.  Physical Exam  Constitutional: She appears  well-developed and well-nourished. No distress.  Musculoskeletal:       Right upper arm: She exhibits tenderness and swelling. She exhibits no edema and no deformity.       Left upper arm: She exhibits tenderness and swelling. She exhibits no deformity.       Right forearm: She exhibits tenderness and swelling. She exhibits no deformity.  Neg Tinel's on right; reduced R>L elbow and shoulder ROM with inability to fully extend right elbow. No bursa swelling  Neurological: She is alert. She has normal strength.  Skin: Skin is warm and dry. No rash noted.  Psychiatric: She has a normal mood and affect. Her behavior is normal.     Labs reviewed: Office Visit on 06/03/2014  Component Date Value Ref Range Status  . Glucose 06/03/2014 108* 65 - 99 mg/dL Final  . BUN 16/12/9602 18  8 - 27 mg/dL Final  . Creatinine, Ser 06/03/2014 0.90  0.57 - 1.00 mg/dL Final  . GFR calc non Af Amer 06/03/2014 57* >59 mL/min/1.73 Final  . GFR calc Af Amer 06/03/2014 66  >59 mL/min/1.73 Final  . BUN/Creatinine Ratio 06/03/2014 20  11 - 26 Final  . Sodium 06/03/2014 146* 134 - 144 mmol/L Final  . Potassium 06/03/2014 4.0  3.5 - 5.2 mmol/L Final  . Chloride 06/03/2014 107  97 - 108 mmol/L Final  . CO2 06/03/2014 22  18 - 29 mmol/L Final  . Calcium 06/03/2014 9.8  8.7 - 10.3 mg/dL Final  . Total Protein 06/03/2014 7.2  6.0 - 8.5 g/dL Final  . Albumin 54/11/8117 4.2  3.5 - 4.7 g/dL Final  . Globulin, Total 06/03/2014 3.0  1.5 - 4.5 g/dL Final  . Albumin/Globulin Ratio 06/03/2014 1.4  1.1 - 2.5 Final  . Bilirubin Total 06/03/2014 0.5  0.0 - 1.2 mg/dL Final  . Alkaline Phosphatase 06/03/2014 96  39 - 117 IU/L Final  . AST 06/03/2014 19  0 - 40 IU/L Final  . ALT 06/03/2014 11  0 - 32 IU/L Final  . Cholesterol, Total 06/03/2014 154  100 - 199 mg/dL Final  . Triglycerides 06/03/2014 106  0 - 149 mg/dL Final  . HDL 14/78/2956 49  >39 mg/dL Final   Comment: According to ATP-III Guidelines, HDL-C >59 mg/dL is  considered a negative risk factor for CHD.   Marland Kitchen VLDL Cholesterol Cal 06/03/2014 21  5 - 40 mg/dL Final  .  LDL Calculated 06/03/2014 84  0 - 99 mg/dL Final  . Chol/HDL Ratio 06/03/2014 3.1  0.0 - 4.4 ratio units Final   Comment:                                   T. Chol/HDL Ratio                                             Men  Women                               1/2 Avg.Risk  3.4    3.3                                   Avg.Risk  5.0    4.4                                2X Avg.Risk  9.6    7.1                                3X Avg.Risk 23.4   11.0   . Free T4 06/03/2014 1.26  0.82 - 1.77 ng/dL Final  . TSH 16/12/9602 1.530  0.450 - 4.500 uIU/mL Final  . Specific Gravity, UA 06/03/2014 1.015  1.005 - 1.030 Final  . pH, UA 06/03/2014 6.0  5.0 - 7.5 Final  . Color, UA 06/03/2014 Yellow  Yellow Final  . Appearance Ur 06/03/2014 Cloudy* Clear Final  . Leukocytes, UA 06/03/2014 3+* Negative Final  . Protein, UA 06/03/2014 1+* Negative/Trace Final  . Glucose, UA 06/03/2014 Negative  Negative Final  . Ketones, UA 06/03/2014 Negative  Negative Final  . RBC, UA 06/03/2014 3+* Negative Final  . Bilirubin, UA 06/03/2014 Negative  Negative Final  . Urobilinogen, Ur 06/03/2014 0.2  0.2 - 1.0 mg/dL Final  . Nitrite, UA 54/11/8117 Positive* Negative Final  . Microscopic Examination 06/03/2014 See below:   Final   Microscopic was indicated and was performed.  . WBC 06/03/2014 6.8  3.4 - 10.8 x10E3/uL Final  . RBC 06/03/2014 4.91  3.77 - 5.28 x10E6/uL Final  . Hemoglobin 06/03/2014 14.3  11.1 - 15.9 g/dL Final  . HCT 14/78/2956 43.9  34.0 - 46.6 % Final  . MCV 06/03/2014 89  79 - 97 fL Final  . MCH 06/03/2014 29.1  26.6 - 33.0 pg Final  . MCHC 06/03/2014 32.6  31.5 - 35.7 g/dL Final  . RDW 21/30/8657 13.9  12.3 - 15.4 % Final  . Platelets 06/03/2014 217  150 - 379 x10E3/uL Final  . Neutrophils Relative % 06/03/2014 49   Final  . Lymphs 06/03/2014 39   Final  . Monocytes 06/03/2014 7   Final  .  Eos 06/03/2014 4   Final  . Basos 06/03/2014 1   Final  . Neutrophils Absolute 06/03/2014 3.4  1.4 - 7.0 x10E3/uL Final  . Lymphocytes Absolute 06/03/2014 2.6  0.7 - 3.1 x10E3/uL Final  . Monocytes Absolute 06/03/2014 0.4  0.1 - 0.9 x10E3/uL Final  . Eosinophils Absolute  06/03/2014 0.3  0.0 - 0.4 x10E3/uL Final  . Basophils Absolute 06/03/2014 0.1  0.0 - 0.2 x10E3/uL Final  . Immature Granulocytes 06/03/2014 0   Final  . Immature Grans (Abs) 06/03/2014 0.0  0.0 - 0.1 x10E3/uL Final  . WBC, UA 06/03/2014 >30* 0 -  5 /hpf Final   Clumps of leukocytes present.  . RBC, UA 06/03/2014 >30* 0 -  2 /hpf Final  . Epithelial Cells (non renal) 06/03/2014 0-10  0 - 10 /hpf Final  . Casts 06/03/2014 None seen  None seen /lpf Final  . Mucus, UA 06/03/2014 Present  Not Estab. Final  . Bacteria, UA 06/03/2014 Many* None seen/Few Final  . specimen status report 06/03/2014 Comment   Final   Comment: Written Authorization Written Authorization Written Authorization Received. Authorization received from [facility] 06-06-2014 Logged by Lavone Orn There is no longer any sample available for the following requested testing. Test # [id number] Urine Culture, Routine     No results found.   Assessment/Plan    ICD-9-CM ICD-10-CM   1. Shoulder pain, unspecified laterality 719.41 M25.519    R>L  2. Insomnia - unchanged 780.52 G47.00   3. Situational depression - unchanged 309.0 F43.21   4. Dementia, without behavioral disturbance 294.20 F03.90   5. Primary osteoarthritis involving multiple joints - pain uncontrolled 715.09 M15.0 meloxicam (MOBIC) 15 MG tablet   --may need to add additional sleep med if trazodone does not help  --Increase trazodone to 1 tablet at bedtime to help with sleep and mood  --Try melatonin  OTC at bedtime to help sleep  --Continue other medications as ordered  --Follow up in 3 mos. Keep appointment with vascular surgery as scheduled  --No further mammograms needed  due to she has aged out  Samiah Ricklefs S. Ancil Linsey  [facility] [address] [place], [place] [address] [phone] Cell (Monday-Friday 8 AM - 5 PM) [phone] After 5 PM and follow prompts

## 2014-07-26 ENCOUNTER — Other Ambulatory Visit (HOSPITAL_COMMUNITY): Payer: Self-pay

## 2014-07-26 ENCOUNTER — Encounter (HOSPITAL_COMMUNITY): Payer: Self-pay

## 2014-07-26 ENCOUNTER — Encounter: Payer: Self-pay | Admitting: Vascular Surgery

## 2014-08-08 ENCOUNTER — Telehealth: Payer: Self-pay | Admitting: *Deleted

## 2014-08-08 NOTE — Telephone Encounter (Signed)
Patient son called and LMOM and wanted to know when patient's vascular appointment was and when. Called him back and left him a message with the referral details. Appointment is 09/06/2014 at [facility] #: [phone] [place] [id number]

## 2014-08-09 ENCOUNTER — Telehealth: Payer: Self-pay | Admitting: *Deleted

## 2014-08-09 NOTE — Telephone Encounter (Signed)
Patient son called and wanted to know the chance of patient getting an infection with bedbug bites. I told him that there is a chance of infection with any kind of bite and the sooner she could get out of that environment the better. He agreed but stated that the caregivers lease is not up until July. I told him that she should get out immediately. He stated that it was bad and that the caregiver spent over $1000 to get rid of them but no success and now she is trying to get out of the lease.

## 2014-08-22 ENCOUNTER — Encounter: Payer: Self-pay | Admitting: Internal Medicine

## 2014-09-02 ENCOUNTER — Encounter: Payer: Self-pay | Admitting: Vascular Surgery

## 2014-09-06 ENCOUNTER — Other Ambulatory Visit: Payer: Self-pay | Admitting: Vascular Surgery

## 2014-09-06 ENCOUNTER — Encounter: Payer: Self-pay | Admitting: Vascular Surgery

## 2014-09-06 ENCOUNTER — Ambulatory Visit (INDEPENDENT_AMBULATORY_CARE_PROVIDER_SITE_OTHER): Payer: Federal, State, Local not specified - PPO | Admitting: Vascular Surgery

## 2014-09-06 ENCOUNTER — Ambulatory Visit (INDEPENDENT_AMBULATORY_CARE_PROVIDER_SITE_OTHER)
Admission: RE | Admit: 2014-09-06 | Discharge: 2014-09-06 | Disposition: A | Discharge status: Home / Self Care | Payer: Federal, State, Local not specified - PPO | Source: Ambulatory Visit | Attending: Vascular Surgery | Admitting: Vascular Surgery

## 2014-09-06 ENCOUNTER — Ambulatory Visit (HOSPITAL_COMMUNITY)
Admission: RE | Admit: 2014-09-06 | Discharge: 2014-09-06 | Disposition: A | Discharge status: Home / Self Care | Payer: Federal, State, Local not specified - PPO | Source: Ambulatory Visit | Attending: Vascular Surgery | Admitting: Vascular Surgery

## 2014-09-06 VITALS — BP 178/60 | HR 66 | Temp 97.9°F | Resp 16 | Ht 64.0 in

## 2014-09-06 DIAGNOSIS — I739 Peripheral vascular disease, unspecified: Secondary | ICD-10-CM

## 2014-09-06 NOTE — Progress Notes (Signed)
Subjective:     Patient ID: Melanie Parker, female   DOB: [birth year], 81 y.o.   MRN: [id number]  HPI this 81 year old female was referred for evaluation of her arterial system in the legs. She has no history of gangrene, infection, nonhealing ulcers, or rest pain. She does complain of some swelling in her ankles. She is nonambulatory spending most her time in a wheelchair. She is accompanied by her son. He is concerned about getting her toenails cut. She has no history of diabetes mellitus. She does not smoke.  Past Medical History  Diagnosis Date  . High blood pressure   . High cholesterol   . Overactive thyroid gland   . Cataract, right   . Mild dementia   . Arthritis   . Spinal stenosis   . Carpal tunnel syndrome   . History of recurrent UTI (urinary tract infection)   . Glaucoma     History  Substance Use Topics  . Smoking status: Never Smoker   . Smokeless tobacco: Current User    Types: Snuff  . Alcohol Use: No    Family History  Problem Relation Age of Onset  . Cancer Brother     Prostate   . Arthritis Mother   . Heart disease Mother   . Heart disease Father   . Lung disease Brother     Allergies  Allergen Reactions  . Ivp Dye [Iodinated Diagnostic Agents] Anaphylaxis  . Naprosyn [Naproxen]      Current outpatient prescriptions:  .  amLODipine (NORVASC) 10 MG tablet, Take 10 mg by mouth daily. For blood pressure, Disp: , Rfl:  .  atorvastatin (LIPITOR) 10 MG tablet, Take 10 mg by mouth daily. For high  cholesterol, Disp: , Rfl:  .  brimonidine (ALPHAGAN) 0.15 % ophthalmic solution, Place 1 drop into the right eye 3 (three) times daily. , Disp: , Rfl:  .  docusate sodium (COLACE) 50 MG capsule, Take 50 mg by mouth every other day. For constipation, Disp: , Rfl:  .  DORZOLAMIDE HCL-TIMOLOL MAL OP, Apply to eye. adminster 1 drops in right eye 2 x daily, Disp: , Rfl:  .  Glucosamine-Fish Oil-EPA-DHA (GLUCOSAMINE & FISH OIL PO), Take by mouth., Disp: , Rfl:  .   KRILL OIL PO, Take by mouth., Disp: , Rfl:  .  latanoprost (XALATAN) 0.005 % ophthalmic solution, Place 1 drop into both eyes at bedtime., Disp: , Rfl:  .  lisinopril (PRINIVIL,ZESTRIL) 10 MG tablet, Take 10 mg by mouth daily. For blood pressure, Disp: , Rfl:  .  meloxicam (MOBIC) 15 MG tablet, Take 1 tablet (15 mg total) by mouth daily., Disp: 30 tablet, Rfl: 6 .  methimazole (TAPAZOLE) 5 MG tablet, Take 1 tablet (5 mg total) by mouth daily. For hyperthyroidism (Patient taking differently: Take 5 mg by mouth daily. For hyperthyroidism Monday-Friday only), Disp: 30 tablet, Rfl: 3 .  nystatin (MYCOSTATIN/NYSTOP) 100000 UNIT/GM POWD, Apply as directed x 1 week, Disp: 1 Bottle, Rfl: 1 .  traZODone (DESYREL) 50 MG tablet, Take 1 tablet (50 mg total) by mouth at bedtime., Disp: 30 tablet, Rfl: 6  Filed Vitals:   09/06/14 1008  BP: 178/60  Pulse: 66  Temp: 97.9 F (36.6 C)  TempSrc: Oral  Resp: 16  Height:  (1.626 m)  SpO2: 96%    There is no weight on file to calculate BMI.           Review of Systems denies chest pain, dyspnea on exertion. Complains of  weakness in her arms and legs. Also has history of thyroid problems and kidney stones.     Objective:   Physical Exam BP 178/60 mmHg  Pulse 66  Temp(Src) 97.9 F (36.6 C) (Oral)  Resp 16  Ht 5\' 4"  (1.626 m)  Wt   SpO2 96%  Gen.-alert and oriented x3 in no apparent distress-elderly and frail-in a wheelchair. HEENT normal for age Lungs no rhonchi or wheezing Cardiovascular regular rhythm no murmurs carotid pulses 3+ palpable no bruits audible Abdomen soft nontender no palpable masses Musculoskeletal free of  major deformities Skin clear -no rashes Neurologic normal Lower extremities 3+ femoral and 1-2+ popliteal pulses palpable bilaterally. 1+ edema both ankles and feet. No evidence of ulceration, gangrene, cellulitis, -both feet appear adequately perfused.  Today I ordered lower extremity arterial study bilaterally  which revealed widely patent superficial femoral arteries down to the popliteal level. Distal vessels have monophasic flow with diffuse plaque. Patient does have brisk flow and tibial vessels of both feet however.       Assessment:     PAD with diffuse plaque particularly in tibial vessels-not limb threatening No evidence of nonhealing ulceration gangrene or infection Chronic mild edema both lower extremities-likely positional since patient is in wheelchair most of day    Plan:     No further workup of arterial or venous system is indicated. Would only pursue arterial disease if patient developed gangrene or nonhealing ulcer Discussed with son importance of avoiding pressure sores on heels and keep her from wearing tight shoes Return on when necessary basis

## 2014-09-16 ENCOUNTER — Other Ambulatory Visit: Payer: Self-pay

## 2014-09-16 DIAGNOSIS — B372 Candidiasis of skin and nail: Secondary | ICD-10-CM

## 2014-09-16 MED ORDER — NYSTATIN 100000 UNIT/GM EX POWD: CUTANEOUS | Status: DC

## 2014-09-16 NOTE — Telephone Encounter (Signed)
Spoke with son and care giver, the rash is almost gone, they feel one more bottle will do it. The problem is the patient wears too much clothes and the area stays hot and wet. They are trying to get her not to wear so much. Spoke with Dr. Montez Moritaarter she ok one more refill. Called son to let him know.

## 2014-10-07 ENCOUNTER — Ambulatory Visit: Payer: Federal, State, Local not specified - PPO | Admitting: Internal Medicine

## 2014-10-14 ENCOUNTER — Encounter: Payer: Self-pay | Admitting: Internal Medicine

## 2014-10-14 ENCOUNTER — Ambulatory Visit (INDEPENDENT_AMBULATORY_CARE_PROVIDER_SITE_OTHER): Payer: Federal, State, Local not specified - PPO | Admitting: Internal Medicine

## 2014-10-14 VITALS — BP 108/60 | HR 99 | Temp 98.6°F | Resp 20 | Ht 64.0 in

## 2014-10-14 DIAGNOSIS — I10 Essential (primary) hypertension: Secondary | ICD-10-CM | POA: Diagnosis not present

## 2014-10-14 DIAGNOSIS — H6121 Impacted cerumen, right ear: Secondary | ICD-10-CM | POA: Diagnosis not present

## 2014-10-14 DIAGNOSIS — E785 Hyperlipidemia, unspecified: Secondary | ICD-10-CM

## 2014-10-14 DIAGNOSIS — F0390 Unspecified dementia without behavioral disturbance: Secondary | ICD-10-CM

## 2014-10-14 DIAGNOSIS — M150 Primary generalized (osteo)arthritis: Secondary | ICD-10-CM

## 2014-10-14 DIAGNOSIS — M159 Polyosteoarthritis, unspecified: Secondary | ICD-10-CM

## 2014-10-14 DIAGNOSIS — E870 Hyperosmolality and hypernatremia: Secondary | ICD-10-CM | POA: Diagnosis not present

## 2014-10-14 DIAGNOSIS — B888 Other specified infestations: Secondary | ICD-10-CM

## 2014-10-14 DIAGNOSIS — E0590 Thyrotoxicosis, unspecified without thyrotoxic crisis or storm: Secondary | ICD-10-CM | POA: Diagnosis not present

## 2014-10-14 NOTE — Progress Notes (Signed)
Patient ID: Melanie Parker, female   DOB: 12-23-1923, 81 y.o.   MRN: [id number]    Location:    [facility]   Place of Service:  OFFICE   Chief Complaint  Patient presents with  . Medical Management of Chronic Issues    3 month follow-up    HPI:  81 yo female seen today for f/u. Her living situation is strained- home infested with bed bugs. She is sleeping in her w/c due to fear of sleeping in bed. Looking for new residence and will be moving in 1-2 mos. She currently lives with caregivers. Home health refused to continue care due to bed bug infestation. Son is present today.   She c/o ear wax issues today. She has seasonal allergy sx's. She has req'd ear lavages in the past  BP controlled on lisinopril and amlodipine. No myalgias on lipitor.  She takes tapazole for hyperthyroid. No palpitations  Appetite is poor but she is trying to drink more fluids  Trazodone helps her to sleep at night  She has arthralgias and takes mobic which helps.  She sees eye specialist on a regular basis to monitor glaucoma  She is a poor historian due to dementia. Hx obtained from son. She does not take any meds for dementia   Past Medical History  Diagnosis Date  . High blood pressure   . High cholesterol   . Overactive thyroid gland   . Cataract, right   . Mild dementia   . Arthritis   . Spinal stenosis   . Carpal tunnel syndrome   . History of recurrent UTI (urinary tract infection)   . Glaucoma     Past Surgical History  Procedure Laterality Date  . Carpal tunnel release    . Cataract extraction  2000    Patient Care Team: Kirt Boys, DO as PCP - General (Internal Medicine)  History   Social History  . Marital Status: Widowed    Spouse Name: N/A  . Number of Children: N/A  . Years of Education: N/A   Occupational History  . Not on file.   Social History Main Topics  . Smoking status: Never Smoker   . Smokeless tobacco: Current User    Types: Snuff  . Alcohol Use: No  .  Drug Use: No  . Sexual Activity: Not on file   Other Topics Concern  . 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 Her smokeless tobacco use includes Snuff. She reports that she does not drink alcohol or use illicit drugs.  Allergies  Allergen Reactions  . Ivp Dye [Iodinated Diagnostic Agents] Anaphylaxis  . Naprosyn [Naproxen]     Medications: Patient's Medications  New Prescriptions   No medications on file  Previous Medications   AMLODIPINE (NORVASC) 10 MG TABLET    Take 10 mg by mouth daily. For blood pressure   ATORVASTATIN (LIPITOR) 10 MG TABLET    Take 10 mg by mouth daily. For high  cholesterol   BRIMONIDINE (ALPHAGAN) 0.15 % OPHTHALMIC SOLUTION    Place 1 drop into the right eye 3 (three) times daily.    DOCUSATE SODIUM (COLACE) 50 MG CAPSULE    Take 50 mg by mouth every other day. For  constipation   DORZOLAMIDE HCL-TIMOLOL MAL OP    Apply to eye. adminster 1 drops in right eye 2 x daily   GLUCOSAMINE-FISH OIL-EPA-DHA (GLUCOSAMINE & FISH OIL PO)    Take by mouth.   KRILL OIL PO    Take by mouth.   LATANOPROST (XALATAN) 0.005 % OPHTHALMIC SOLUTION    Place 1 drop into both eyes at bedtime.   LISINOPRIL (PRINIVIL,ZESTRIL) 10 MG TABLET    Take 10 mg by mouth daily. For blood pressure   MELOXICAM (MOBIC) 15 MG TABLET    Take 1 tablet (15 mg total) by mouth daily.   METHIMAZOLE (TAPAZOLE) 5 MG TABLET    Take 1 tablet (5 mg total) by mouth daily. For hyperthyroidism   NON FORMULARY    Herbalife  Healthy Meal (Comida Salud[employer]) give as directed   NYSTATIN (MYCOSTATIN/NYSTOP) 100000 UNIT/GM POWD    Apply as directed x 1 week   TRAZODONE (DESYREL) 50 MG TABLET    Take 1 tablet (50 mg total) by mouth at bedtime.  Modified Medications   No medications on file    Discontinued Medications   No medications on file    Review of Systems  Unable to perform ROS: Dementia    Filed Vitals:   10/14/14 1059  BP: 108/60  Pulse: 99  Temp: 98.6 F (37 C)  TempSrc: Oral  Resp: 20  Height: 5\' 4"  (1.626 m)  SpO2: 99%   There is no weight on file to calculate BMI.  Physical Exam  Constitutional: She appears well-developed. No distress.  Looks frail appearing and disheveled in NAD. Sitting in w/c. Son present  HENT:  Mouth/Throat: Oropharynx is clear and moist. No oropharyngeal exudate.  Right cerumen impaction  Eyes: Pupils are equal, round, and reactive to light. No scleral icterus.  Neck: Neck supple. Carotid bruit is not present. No tracheal deviation present. No thyromegaly present.  Cardiovascular: Normal rate, regular rhythm and intact distal pulses.  Exam reveals no gallop and no friction rub.   Murmur (2/6 SEM) heard. +2 pitting LE edema b/l. no calf TTP.   Pulmonary/Chest: Effort normal and breath sounds normal. No stridor. No respiratory distress. She has no wheezes. She has no rales.  Abdominal: Soft. Bowel sounds are normal. She exhibits no distension and no mass. There is no hepatomegaly. There is no tenderness. There is no rebound and no guarding.  Musculoskeletal: She exhibits edema and tenderness.  Sitting in w/c  Lymphadenopathy:    She has no cervical adenopathy.  Neurological: She is alert.  Skin: Skin is warm and dry. No rash noted.  No obvious bug bites  Psychiatric: She has a normal mood and affect. Her behavior is normal.     Labs reviewed: No visits with results within 3 Month(s) from this visit. Latest known visit with results is:  Office Visit on 06/03/2014  Component Date Value Ref Range Status  . Glucose 06/03/2014 108* 65 - 99 mg/dL Final  . BUN 16/12/9602 18  8 - 27 mg/dL Final  . Creatinine, Ser 06/03/2014 0.90  0.57 - 1.00 mg/dL Final  . GFR calc non Af Amer 06/03/2014 57* >59 mL/min/1.73 Final  . GFR  calc Af Amer 06/03/2014 66  >59 mL/min/1.73 Final  . BUN/Creatinine Ratio 06/03/2014 20  11 - 26 Final  . Sodium 06/03/2014 146* 134 - 144 mmol/L Final  . Potassium 06/03/2014 4.0  3.5 - 5.2 mmol/L Final  . Chloride 06/03/2014 107  97 - 108 mmol/L Final  .  CO2 06/03/2014 22  18 - 29 mmol/L Final  . Calcium 06/03/2014 9.8  8.7 - 10.3 mg/dL Final  . Total Protein 06/03/2014 7.2  6.0 - 8.5 g/dL Final  . Albumin 16/12/9602 4.2  3.5 - 4.7 g/dL Final  . Globulin, Total 06/03/2014 3.0  1.5 - 4.5 g/dL Final  . Albumin/Globulin Ratio 06/03/2014 1.4  1.1 - 2.5 Final  . Bilirubin Total 06/03/2014 0.5  0.0 - 1.2 mg/dL Final  . Alkaline Phosphatase 06/03/2014 96  39 - 117 IU/L Final  . AST 06/03/2014 19  0 - 40 IU/L Final  . ALT 06/03/2014 11  0 - 32 IU/L Final  . Cholesterol, Total 06/03/2014 154  100 - 199 mg/dL Final  . Triglycerides 06/03/2014 106  0 - 149 mg/dL Final  . HDL 54/11/8117 49  >39 mg/dL Final   Comment: According to ATP-III Guidelines, HDL-C >59 mg/dL is considered a negative risk factor for CHD.   Marland Kitchen VLDL Cholesterol Cal 06/03/2014 21  5 - 40 mg/dL Final  . LDL Calculated 06/03/2014 84  0 - 99 mg/dL Final  . Chol/HDL Ratio 06/03/2014 3.1  0.0 - 4.4 ratio units Final   Comment:                                   T. Chol/HDL Ratio                                             Men  Women                               1/2 Avg.Risk  3.4    3.3                                   Avg.Risk  5.0    4.4                                2X Avg.Risk  9.6    7.1                                3X Avg.Risk 23.4   11.0   . Free T4 06/03/2014 1.26  0.82 - 1.77 ng/dL Final  . TSH 14/78/2956 1.530  0.450 - 4.500 uIU/mL Final  . Specific Gravity, UA 06/03/2014 1.015  1.005 - 1.030 Final  . pH, UA 06/03/2014 6.0  5.0 - 7.5 Final  . Color, UA 06/03/2014 Yellow  Yellow Final  . Appearance Ur 06/03/2014 Cloudy* Clear Final  . Leukocytes, UA 06/03/2014 3+* Negative Final  . Protein, UA 06/03/2014 1+*  Negative/Trace Final  . Glucose, UA 06/03/2014 Negative  Negative Final  . Ketones, UA 06/03/2014 Negative  Negative Final  . RBC, UA 06/03/2014 3+* Negative Final  . Bilirubin, UA 06/03/2014 Negative  Negative Final  . Urobilinogen, Ur 06/03/2014 0.2  0.2 - 1.0 mg/dL Final  . Nitrite, UA 21/30/8657 Positive* Negative Final  . Microscopic Examination 06/03/2014 See below:   Final   Microscopic was indicated and was performed.  . WBC  06/03/2014 6.8  3.4 - 10.8 x10E3/uL Final  . RBC 06/03/2014 4.91  3.77 - 5.28 x10E6/uL Final  . Hemoglobin 06/03/2014 14.3  11.1 - 15.9 g/dL Final  . HCT 54/11/8117 43.9  34.0 - 46.6 % Final  . MCV 06/03/2014 89  79 - 97 fL Final  . MCH 06/03/2014 29.1  26.6 - 33.0 pg Final  . MCHC 06/03/2014 32.6  31.5 - 35.7 g/dL Final  . RDW 14/78/2956 13.9  12.3 - 15.4 % Final  . Platelets 06/03/2014 217  150 - 379 x10E3/uL Final  . Neutrophils Relative % 06/03/2014 49   Final  . Lymphs 06/03/2014 39   Final  . Monocytes 06/03/2014 7   Final  . Eos 06/03/2014 4   Final  . Basos 06/03/2014 1   Final  . Neutrophils Absolute 06/03/2014 3.4  1.4 - 7.0 x10E3/uL Final  . Lymphocytes Absolute 06/03/2014 2.6  0.7 - 3.1 x10E3/uL Final  . Monocytes Absolute 06/03/2014 0.4  0.1 - 0.9 x10E3/uL Final  . Eosinophils Absolute 06/03/2014 0.3  0.0 - 0.4 x10E3/uL Final  . Basophils Absolute 06/03/2014 0.1  0.0 - 0.2 x10E3/uL Final  . Immature Granulocytes 06/03/2014 0   Final  . Immature Grans (Abs) 06/03/2014 0.0  0.0 - 0.1 x10E3/uL Final  . WBC, UA 06/03/2014 >30* 0 -  5 /hpf Final   Clumps of leukocytes present.  . RBC, UA 06/03/2014 >30* 0 -  2 /hpf Final  . Epithelial Cells (non renal) 06/03/2014 0-10  0 - 10 /hpf Final  . Casts 06/03/2014 None seen  None seen /lpf Final  . Mucus, UA 06/03/2014 Present  Not Estab. Final  . Bacteria, UA 06/03/2014 Many* None seen/Few Final  . specimen status report 06/03/2014 Comment   Final   Comment: Written Authorization Written  Authorization Written Authorization Received. Authorization received from [facility] 06-06-2014 Logged by Lavone Orn There is no longer any sample available for the following requested testing. Test # [id number] Urine Culture, Routine     No results found.   Assessment/Plan   ICD-9-CM ICD-10-CM   1. Cerumen impaction, right 380.4 H61.21   2. Infestation by bed bug 134.8 B88.8   3. Dementia, without behavioral disturbance - stable 294.20 F03.90 CMP  4. Primary osteoarthritis involving multiple joints - stable 715.09 M15.0   5. Hyperthyroidism - controlled 242.90 E05.90 TSH     T4, Free  6. Essential hypertension - controlled 401.9 I10   7. Hyperlipidemia - controlled 272.4 E78.5   8. Hypernatremia  276.0 E87.0 CMP   --ear lavage to right ear - several living bed bugs washed out along with cerumen  --highly recommend relocating to new residence as soon as possible. She is in an unhealthy living situation  --Continue current medications as ordered  --Follow up in 3 mos for routine visit  --Keep legs elevated when seated  --Call insurance to see if they will cover incontinence supplies  Eva S. Ancil Linsey  [facility] and [facility] [address] [place], [place] [address] [phone] Cell (Monday-Friday 8 AM - 5 PM) [phone] After 5 PM and follow prompts

## 2014-10-14 NOTE — Patient Instructions (Signed)
Continue current medications as ordered  Follow up in 3 mos   Keep legs elevated when seated  Call insurance to see if they will cover incontinence supplies

## 2014-10-15 LAB — COMPREHENSIVE METABOLIC PANEL
A/G RATIO: 1.3 (ref 1.1–2.5)
ALK PHOS: 75 IU/L (ref 39–117)
ALT: 9 IU/L (ref 0–32)
AST: 12 IU/L (ref 0–40)
Albumin: 3.6 g/dL (ref 3.5–4.7)
BILIRUBIN TOTAL: 0.3 mg/dL (ref 0.0–1.2)
BUN / CREAT RATIO: 20 (ref 11–26)
BUN: 17 mg/dL (ref 8–27)
CHLORIDE: 106 mmol/L (ref 97–108)
CO2: 22 mmol/L (ref 18–29)
Calcium: 8.9 mg/dL (ref 8.7–10.3)
Creatinine, Ser: 0.84 mg/dL (ref 0.57–1.00)
GFR calc Af Amer: 72 mL/min/{1.73_m2} (ref >59)
GFR, EST NON AFRICAN AMERICAN: 62 mL/min/{1.73_m2} (ref >59)
Globulin, Total: 2.7 g/dL (ref 1.5–4.5)
Glucose: 133 mg/dL — ABNORMAL HIGH (ref 65–99)
POTASSIUM: 4.2 mmol/L (ref 3.5–5.2)
SODIUM: 144 mmol/L (ref 134–144)
Total Protein: 6.3 g/dL (ref 6.0–8.5)

## 2014-10-15 LAB — TSH: TSH: 0.798 u[IU]/mL (ref 0.450–4.500)

## 2014-10-15 LAB — T4, FREE: FREE T4: 1.31 ng/dL (ref 0.82–1.77)

## 2014-10-17 ENCOUNTER — Telehealth: Payer: Self-pay | Admitting: *Deleted

## 2014-10-17 DIAGNOSIS — S31809A Unspecified open wound of unspecified buttock, initial encounter: Secondary | ICD-10-CM

## 2014-10-17 NOTE — Telephone Encounter (Signed)
Patient son called and stated that patient developed a bedsore on her bottom over the weekend. Son bought some pads for patient to sit on to relieve pressure. Some redness and tenderness, no fever and no drainage. Putting neosporin and gauze on it now. Please Advise.

## 2014-10-18 NOTE — Telephone Encounter (Signed)
Called and spoke with Dr. Montez Morita regarding patient. She stated to send patient to outpatient wound care-[facility] and call [facility]. Order placed for Wound Care and called [facility] and spoke with Rosalio Macadamia [phone] and spent 35 minutes answering questions regarding patient. She stated that she will submit this and send [place] a letter with the results.

## 2014-10-20 ENCOUNTER — Telehealth: Payer: Self-pay | Admitting: *Deleted

## 2014-10-20 NOTE — Telephone Encounter (Signed)
Melanie Parker (supervisor with [facility]) called and wanted to know why we didn't call 911 when the bedbugs were so bad and wanted to know why we were just now calling [facility]. I explained to him that we talked with Home health in April and they stated that they were calling [facility] for this. He stated "well obviously they didn't because the patient is not in our system" and asked again why we didn't call 911. I told him I didn't have the authority to make that decision when she was seeing the provider and he stated that he wants to speak to the person in charge because 911 should have been called. I went and spoke with Aram Beecham regarding the situation and she was going to talk with him but he hung up.

## 2014-10-20 NOTE — Telephone Encounter (Signed)
Patient son, Archie Patten called and requested the results from bloodwork. Given results and informed him that they were mailed also. Agreed.

## 2014-10-21 ENCOUNTER — Telehealth: Payer: Self-pay | Admitting: Internal Medicine

## 2014-10-21 NOTE — Telephone Encounter (Signed)
Received the Adults Protective Services information.  They will be evaluate based on the allegations.  They will follow up with [facility]...cdavis

## 2014-10-24 ENCOUNTER — Telehealth: Payer: Self-pay | Admitting: *Deleted

## 2014-10-24 NOTE — Telephone Encounter (Signed)
Son, Archie Patten called and stated that he got a call from [facility] and they told him that he needed to get up with Dr. Montez Morita to get the ball rolling with getting his mother into a nursing home/assisted living temporally. He stated that he needs your help with who you recommend. He has to call them back tomorrow. They told him that this will be taken to court if not done and they will strip him of his POA and take her from him. Social worker told him that she receives too much money for her to apply for Medicaid and he is not sure how this is going to get covered. He stated that social workers showed up at the caregivers house Saturday unannounced. Son stated that he spends money out of his own pocket to try to get rid of bedbugs and also have tried to talk with the landlord. He works until 1:30 in the morning and then goes and checks on his mom. Want to know your opinion on how to get his mother placed somewhere. Please Advise.

## 2014-10-24 NOTE — Telephone Encounter (Signed)
We need to supply him with a listing of nursing facilities in the area. If he is interested, I round at both [facility] and continue seeing here at those facilities. Another option would be to take her to the ER to have her evaluated. We will need to complete FL2  Either way she will need TB skin test. SNF will likely require her to be bedbug free prior to admission as this poses a health risk to the other residents.

## 2014-10-25 NOTE — Telephone Encounter (Signed)
LMOM to return call.

## 2014-10-31 ENCOUNTER — Other Ambulatory Visit: Payer: Self-pay | Admitting: *Deleted

## 2014-10-31 ENCOUNTER — Telehealth: Payer: Self-pay | Admitting: *Deleted

## 2014-10-31 MED ORDER — LISINOPRIL 10 MG PO TABS: 10.0000 mg | ORAL_TABLET | Freq: Every day | ORAL | Status: DC

## 2014-10-31 NOTE — Telephone Encounter (Signed)
Melanie Parker spoke with [facility] on Friday 10/28/2014

## 2014-10-31 NOTE — Telephone Encounter (Signed)
Ok to complete FL2 form. Please have ready to be signed on Wednesday Aug 17th. Thanks

## 2014-10-31 NOTE — Telephone Encounter (Signed)
Ms. Dareen Piano from Social Services called and stated that patient's son is ready to place patient in facility and social service is needing an FL2 Form filled out for placement. A social worker there is going to help him place her at a facility. Environmental health is going to get involved to help with the bed bug issue before moving her into the facility. Please Advise.   Fax to: [phone] #: [phone]

## 2014-10-31 NOTE — Telephone Encounter (Signed)
[facility] 

## 2014-11-01 NOTE — Telephone Encounter (Signed)
Spoke with Lebron Conners with Social Services and informed her that we will be faxing the [facility] form tomorrow. She agreed.

## 2014-11-04 ENCOUNTER — Telehealth: Payer: Self-pay | Admitting: *Deleted

## 2014-11-04 ENCOUNTER — Encounter (HOSPITAL_BASED_OUTPATIENT_CLINIC_OR_DEPARTMENT_OTHER): Payer: Federal, State, Local not specified - PPO | Attending: Internal Medicine

## 2014-11-04 DIAGNOSIS — I10 Essential (primary) hypertension: Secondary | ICD-10-CM | POA: Insufficient documentation

## 2014-11-04 DIAGNOSIS — I739 Peripheral vascular disease, unspecified: Secondary | ICD-10-CM | POA: Insufficient documentation

## 2014-11-04 DIAGNOSIS — F0390 Unspecified dementia without behavioral disturbance: Secondary | ICD-10-CM | POA: Insufficient documentation

## 2014-11-04 DIAGNOSIS — M1990 Unspecified osteoarthritis, unspecified site: Secondary | ICD-10-CM | POA: Insufficient documentation

## 2014-11-04 DIAGNOSIS — L89153 Pressure ulcer of sacral region, stage 3: Secondary | ICD-10-CM | POA: Insufficient documentation

## 2014-11-04 NOTE — Telephone Encounter (Signed)
Social Worker has found a facility for patient at [facility] [phone]). They stated that she needs a topical cream that will keep the bedbugs from hatching. She needs to start the cream ASAP. To be faxed to pharmacy and call [name] at [facility] when done so she can call patient's POA to pick up. And needs a TB test or Chest X-Ray done. Please Advise.

## 2014-11-04 NOTE — Telephone Encounter (Signed)
Ok for TB skin test. As far as tx of hatching eggs, there is no available tx. I am not aware of any cream that will help. There has been intermediate improvement with permithrin but not definitive. What cream is she referring to?

## 2014-11-04 NOTE — Telephone Encounter (Signed)
I spoke with Melanie Parker and she stated for patient to go to health Dept to get TB testing. [name] aware and will set it up for patient. [name] also wants me to call [name] regarding what kind of cream she was talking about. I called [name] and she stated that she didn't know of a specific cream she was just asking due to the facility protocol.

## 2014-11-05 NOTE — Telephone Encounter (Signed)
Noted - there is no cream available to use in the manner she is talking about for bedbugs. Pt will need to get rid of all her clothes and personal items that can carry the insects prior to admission into the facility. She will need hair shampooed also.

## 2014-11-22 ENCOUNTER — Telehealth: Payer: Self-pay | Admitting: *Deleted

## 2014-11-22 NOTE — Telephone Encounter (Signed)
[name] with Dept of Social Services called and left voice message to return call regarding an update on patient. Called and LMOM to return call.

## 2014-11-23 ENCOUNTER — Non-Acute Institutional Stay (SKILLED_NURSING_FACILITY): Payer: Federal, State, Local not specified - PPO | Admitting: Adult Health

## 2014-11-23 ENCOUNTER — Encounter: Payer: Self-pay | Admitting: Adult Health

## 2014-11-23 DIAGNOSIS — I10 Essential (primary) hypertension: Secondary | ICD-10-CM | POA: Diagnosis not present

## 2014-11-23 DIAGNOSIS — F329 Major depressive disorder, single episode, unspecified: Secondary | ICD-10-CM | POA: Diagnosis not present

## 2014-11-23 DIAGNOSIS — Z720 Tobacco use: Secondary | ICD-10-CM

## 2014-11-23 DIAGNOSIS — H409 Unspecified glaucoma: Secondary | ICD-10-CM

## 2014-11-23 DIAGNOSIS — E0590 Thyrotoxicosis, unspecified without thyrotoxic crisis or storm: Secondary | ICD-10-CM

## 2014-11-23 DIAGNOSIS — L89153 Pressure ulcer of sacral region, stage 3: Secondary | ICD-10-CM | POA: Diagnosis not present

## 2014-11-23 DIAGNOSIS — M150 Primary generalized (osteo)arthritis: Secondary | ICD-10-CM | POA: Diagnosis not present

## 2014-11-23 DIAGNOSIS — G4700 Insomnia, unspecified: Secondary | ICD-10-CM | POA: Diagnosis not present

## 2014-11-23 DIAGNOSIS — M159 Polyosteoarthritis, unspecified: Secondary | ICD-10-CM

## 2014-11-23 DIAGNOSIS — E785 Hyperlipidemia, unspecified: Secondary | ICD-10-CM | POA: Diagnosis not present

## 2014-11-23 DIAGNOSIS — F411 Generalized anxiety disorder: Secondary | ICD-10-CM | POA: Diagnosis not present

## 2014-11-23 DIAGNOSIS — F0390 Unspecified dementia without behavioral disturbance: Secondary | ICD-10-CM | POA: Diagnosis not present

## 2014-11-23 NOTE — Progress Notes (Signed)
Patient ID: Melanie Parker, female   DOB: 04-May-1923, 81 y.o.   MRN: [id number]    Facility: [facility]      Allergies  Allergen Reactions  . Ivp Dye [Iodinated Diagnostic Agents] Anaphylaxis  . Naprosyn [Naproxen]     Chief Complaint  Patient presents with  . Acute Visit    follow up transfer     HPI:  She as been transferred from her home to this facility for long term placement due to poor living environment in her home. She is unable to fully participate in the hpi or ros; but states that she is doing ok. There are no nursing concerns at this time.    Past Medical History  Diagnosis Date  . High blood pressure   . High cholesterol   . Overactive thyroid gland   . Cataract, right   . Mild dementia   . Arthritis   . Spinal stenosis   . Carpal tunnel syndrome   . History of recurrent UTI (urinary tract infection)   . Glaucoma     Past Surgical History  Procedure Laterality Date  . Carpal tunnel release    . Cataract extraction  2000    Family History  Problem Relation Age of Onset  . Cancer Brother     Prostate   . Arthritis Mother   . Heart disease Mother   . Heart disease Father   . Lung disease Brother      Social History   Social History  . Marital Status: Widowed    Spouse Name: N/A  . Number of Children: N/A  . Years of Education: N/A   Occupational History  . Not on file.   Social History Main Topics  . Smoking status: Never Smoker   . Smokeless tobacco: Current User    Types: Snuff  . Alcohol Use: No  . Drug Use: No  . Sexual Activity: Not on file   Other Topics Concern  . 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 For blood pressure   ATORVASTATIN (LIPITOR) 10 MG TABLET    Take 10 mg by mouth daily. For high  cholesterol   BRIMONIDINE (ALPHAGAN) 0.15 % OPHTHALMIC SOLUTION    Place 1 drop into the right eye 3 (three) times daily.    DOCUSATE SODIUM (COLACE) 50 MG CAPSULE    Take 50 mg by mouth every other day. For constipation   DORZOLAMIDE HCL-TIMOLOL MAL OP    Apply to eye. adminster 1 drops in right eye 2 x daily   KRILL OIL PO    Take by mouth.   LATANOPROST (XALATAN) 0.005 % OPHTHALMIC SOLUTION    Place 1 drop into both eyes at bedtime.   LISINOPRIL (PRINIVIL,ZESTRIL) 10 MG TABLET    Take 1 tablet (10 mg total) by mouth daily. For blood pressure   MELOXICAM (MOBIC) 15 MG TABLET    Take 1  tablet (15 mg total) by mouth daily.   METHIMAZOLE (TAPAZOLE) 5 MG TABLET    Take 1 tablet (5 mg total) by mouth daily. For hyperthyroidism   TRAZODONE (DESYREL) 50 MG TABLET    Take 1 tablet (50 mg total) by mouth at bedtime.  Modified Medications   No medications on file  Discontinued Medications     SIGNIFICANT DIAGNOSTIC EXAMS   LABS REVIEWED:   06-03-14: wbc 6.8; hgb 14.3; hct 43.9; mcv 89; plt 217; glucose 108; bun 18; creat 0.90; k+4.0; na++146; liver normal albumin 4.2; chol 154; ldl 84; trig 106; hdl 49; tsh 1.530; free t4: 1.26 10-14-14: glucose 133; bun 17; creat 0.84; k+4.2; na++ 147; liver normal albumin 3.6; tsh 0.798;  free t4: 1.31     Review of Systems  Unable to perform ROS: Dementia      Physical Exam  Constitutional: No distress.  Frail   Eyes: Conjunctivae are normal.  Neck: Neck supple. No JVD present. No thyromegaly present.  Cardiovascular: Normal rate, regular rhythm and intact distal pulses.   Respiratory: Effort normal and breath sounds normal. No respiratory distress. She has no wheezes.  GI: Soft. Bowel sounds  are normal. She exhibits no distension. There is no tenderness.  Musculoskeletal: She exhibits no edema.  Able to move all extremities   Lymphadenopathy:    She has no cervical adenopathy.  Neurological: She is alert.  Skin: Skin is warm and dry. She is not diaphoretic.  Sacral stage II wound: 3.1 x 3.0 cm wound red with scant slough present   Psychiatric: She has a normal mood and affect.     ASSESSMENT/ PLAN:  1. Hypertension: will continue norvasc 10 mg daily and lisinopril 10- mg daily will monitor   2. Dyslipidemia: will continue lipitor 10 mg daily her ldl is 84  3. Osteoarthritis: she is not voicing any complaints at this tie will continue mobic 15 mg daily   4.  Hyperthyroidism: will continue tapazole 5 mg daily; her tsh is 0.798; and free t4: 1.31  5. Dementia: she is presently not taking medications; will not make changes at this time. Will monitor her status.   6. Constipation: will continue colace 50 mg every other day  7. Glaucoma: will continue cosopt to right eye twice daily; alphagan to right eye three times daily and xalatan to right eye nightly   8. Stage III sacral ulcer: will continue treatment per facility protocol and will monitor  9. Tobacco use: does use snuff on a daily basis.    Time spent with patient  50  minutes >50% time spent counseling; reviewing medical record; tests; labs; and developing future plan of care    Synthia Innocent NP [facility]  Contact [phone] Monday through Friday 8am- 5pm  After hours call [phone]

## 2014-11-24 ENCOUNTER — Non-Acute Institutional Stay (SKILLED_NURSING_FACILITY): Payer: Federal, State, Local not specified - PPO | Admitting: Internal Medicine

## 2014-11-24 DIAGNOSIS — M150 Primary generalized (osteo)arthritis: Secondary | ICD-10-CM

## 2014-11-24 DIAGNOSIS — B888 Other specified infestations: Secondary | ICD-10-CM

## 2014-11-24 DIAGNOSIS — F0390 Unspecified dementia without behavioral disturbance: Secondary | ICD-10-CM

## 2014-11-24 DIAGNOSIS — E0590 Thyrotoxicosis, unspecified without thyrotoxic crisis or storm: Secondary | ICD-10-CM

## 2014-11-24 DIAGNOSIS — Z720 Tobacco use: Secondary | ICD-10-CM

## 2014-11-24 DIAGNOSIS — L89153 Pressure ulcer of sacral region, stage 3: Secondary | ICD-10-CM | POA: Diagnosis not present

## 2014-11-24 DIAGNOSIS — I739 Peripheral vascular disease, unspecified: Secondary | ICD-10-CM | POA: Diagnosis not present

## 2014-11-24 DIAGNOSIS — H409 Unspecified glaucoma: Secondary | ICD-10-CM | POA: Diagnosis not present

## 2014-11-24 DIAGNOSIS — M159 Polyosteoarthritis, unspecified: Secondary | ICD-10-CM

## 2014-11-24 DIAGNOSIS — E785 Hyperlipidemia, unspecified: Secondary | ICD-10-CM

## 2014-11-24 DIAGNOSIS — I10 Essential (primary) hypertension: Secondary | ICD-10-CM | POA: Diagnosis not present

## 2014-11-29 ENCOUNTER — Encounter: Payer: Self-pay | Admitting: Internal Medicine

## 2014-11-29 NOTE — Progress Notes (Signed)
Patient ID: Melanie Parker, female   DOB: Apr 15, 1923, 81 y.o.   MRN: [id number]    HISTORY AND PHYSICAL   DATE: 11/24/14  Location:  [facility]    Place of Service: SNF [phone])   Extended Emergency Contact Information Primary Emergency Contact: Aicia, Babinski Address: [address] [place], [place] [address] [place] Work Phone: [phone] Mobile Phone: [phone] Relation: Son  Advanced Directive information  FULL CODE  Chief Complaint  Patient presents with  . New Admit To SNF    HPI:  81 yo female seen today as a new admission into SNF from home following APS investigation. Her home was infested with bedbugs. She will be a long term resident. She has no concerns today. She had a Dr visit this AM but does not know for what. Her son drove her there. She uses snuff on a regular basis. Does not smoke. She reports sleeping better since her arrival to SNF. Prior to living here, she was sleeping in her w/c due to fear of sleeping in the bed. She is a poor historian due to dementia. Hx obtained from chart  HTN/hyperlipidemia - BP stable on lisinopril, amlodipine. Tales fish oil and statin for cholesterol. No myalgias  Dementia - stable. No behavioral issues. She takes trazodone to help her sleep  Hyperthyroid - stable on tapazole. She sees Endo for mx  PAD - stable. Last ABIs done in June 2016 did not show critical limb ischemia  Arthritis - pain controlled on mobic  Glaucoma - stable with eye gtts  Past Medical History  Diagnosis Date  . High blood pressure   . High cholesterol   . Overactive thyroid gland   . Cataract, right   . Mild dementia   . Arthritis   . Spinal stenosis   . Carpal tunnel syndrome   . History of recurrent UTI (urinary tract infection)   . Glaucoma     Past Surgical History  Procedure Laterality Date  . Carpal tunnel release    . Cataract extraction  2000    Patient Care Team: [place], DO as PCP - General (Internal Medicine)  Social History   Social History  . Marital Status: Widowed    Spouse Name: N/A  . Number of Children: N/A  . Years of Education: N/A   Occupational History  . Not on file.   Social History Main Topics  . Smoking status: Never Smoker   . Smokeless tobacco: Current User    Types: Snuff  . Alcohol Use: No  . Drug Use: No  . Sexual Activity: Not on file   Other Topics Concern  . 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 Her smokeless tobacco use includes Snuff. She reports that she does not drink alcohol or use illicit drugs.  Family History  Problem Relation Age of Onset  . Cancer Brother     Prostate   . Arthritis Mother   . Heart disease Mother   . Heart disease Father   . Lung disease Brother    Family Status  Relation Status Death Age  . Mother Deceased   .  Father Deceased   . Brother Deceased   . Brother Deceased   . Son Alive     Immunization History  Administered Date(s) Administered  . Influenza,inj,Quad PF,36+ Mos 01/11/2014  . Pneumococcal-Unspecified 12/01/2012    Allergies  Allergen Reactions  . Ivp Dye [Iodinated Diagnostic Agents] Anaphylaxis  . Naprosyn [Naproxen]     Medications: Patient's Medications  New Prescriptions   No medications on file  Previous Medications   AMLODIPINE (NORVASC) 10 MG TABLET    Take 10 mg by mouth daily. For blood pressure   ATORVASTATIN (LIPITOR) 10 MG TABLET    Take 10 mg by mouth daily. For high  cholesterol   BRIMONIDINE (ALPHAGAN) 0.15 % OPHTHALMIC SOLUTION    Place 1 drop into the right eye 3 (three) times daily.    DOCUSATE SODIUM (COLACE) 50 MG CAPSULE    Take 50 mg by mouth every other day. For constipation   DORZOLAMIDE HCL-TIMOLOL MAL  OP    Apply to eye. adminster 1 drops in right eye 2 x daily   KRILL OIL PO    Take by mouth.   LATANOPROST (XALATAN) 0.005 % OPHTHALMIC SOLUTION    Place 1 drop into both eyes at bedtime.   LISINOPRIL (PRINIVIL,ZESTRIL) 10 MG TABLET    Take 1 tablet (10 mg total) by mouth daily. For blood pressure   MELOXICAM (MOBIC) 15 MG TABLET    Take 1 tablet (15 mg total) by mouth daily.   METHIMAZOLE (TAPAZOLE) 5 MG TABLET    Take 1 tablet (5 mg total) by mouth daily. For hyperthyroidism   TRAZODONE (DESYREL) 50 MG TABLET    Take 1 tablet (50 mg total) by mouth at bedtime.  Modified Medications   No medications on file  Discontinued Medications   No medications on file    Review of Systems  Unable to perform ROS: Dementia    Filed Vitals:   11/24/14 1330  BP: 144/78  Pulse: 72  Temp: 97.5 F (36.4 C)  Weight: 137 lb (62.143 kg)  SpO2: 98%   Body mass index is 24.27 kg/(m^2).  Physical Exam  Constitutional: She appears well-developed and well-nourished.  Looks well in NAD. Sitting in w/c  HENT:  Mouth/Throat: Oropharynx is clear and moist. No oropharyngeal exudate.  Eyes: Pupils are equal, round, and reactive to light. No scleral icterus.  Neck: Neck supple. Carotid bruit is not present. No tracheal deviation present. No thyromegaly present.  Cardiovascular: Normal rate, regular rhythm, normal heart sounds and intact distal pulses.  Exam reveals no gallop and no friction rub.   No murmur heard. Trace LE edema b/l. No calf TTP  Pulmonary/Chest: Effort normal and breath sounds normal. No stridor. No respiratory distress. She has no wheezes. She has no rales.  Abdominal: Soft. Bowel sounds are normal. She exhibits no distension and no mass. There is no hepatomegaly. There is no tenderness. There is no rebound and no guarding.  Musculoskeletal: She exhibits edema and tenderness.  Lymphadenopathy:    She has no cervical adenopathy.  Neurological: She is alert.  Skin: Skin is warm and dry.  No rash noted.  Sacral wound - stage 3 per wound care  Psychiatric: She has a normal mood and affect. Her behavior is normal.     Labs reviewed: Office Visit on 10/14/2014  Component Date Value Ref Range Status  . Glucose 10/14/2014 133* 65 - 99 mg/dL Final  . BUN 16/12/9602 17  8 - 27 mg/dL Final  . Creatinine, Ser  10/14/2014 0.84  0.57 - 1.00 mg/dL Final  . GFR calc non Af Amer 10/14/2014 62  >59 mL/min/1.73 Final  . GFR calc Af Amer 10/14/2014 72  >59 mL/min/1.73 Final  . BUN/Creatinine Ratio 10/14/2014 20  11 - 26 Final  . Sodium 10/14/2014 144  134 - 144 mmol/L Final  . Potassium 10/14/2014 4.2  3.5 - 5.2 mmol/L Final  . Chloride 10/14/2014 106  97 - 108 mmol/L Final  . CO2 10/14/2014 22  18 - 29 mmol/L Final  . Calcium 10/14/2014 8.9  8.7 - 10.3 mg/dL Final  . Total Protein 10/14/2014 6.3  6.0 - 8.5 g/dL Final  . Albumin 16/12/9602 3.6  3.5 - 4.7 g/dL Final  . Globulin, Total 10/14/2014 2.7  1.5 - 4.5 g/dL Final  . Albumin/Globulin Ratio 10/14/2014 1.3  1.1 - 2.5 Final  . Bilirubin Total 10/14/2014 0.3  0.0 - 1.2 mg/dL Final  . Alkaline Phosphatase 10/14/2014 75  39 - 117 IU/L Final  . AST 10/14/2014 12  0 - 40 IU/L Final  . ALT 10/14/2014 9  0 - 32 IU/L Final  . TSH 10/14/2014 0.798  0.450 - 4.500 uIU/mL Final  . Free T4 10/14/2014 1.31  0.82 - 1.77 ng/dL Final    No results found.   Assessment/Plan   ICD-9-CM ICD-10-CM   1. Dementia, without behavioral disturbance - stable 294.20 F03.90   2. Essential hypertension - stable 401.9 I10   3. Stage III pressure ulcer of sacral region  707.03 L89.153    707.23    4. Primary osteoarthritis involving multiple joints - stable 715.09 M15.0   5. Hyperthyroidism 242.90 E05.90   6. Tobacco use 305.1 Z72.0    dips snuff  7. PAD (peripheral artery disease) - stable 443.9 I73.9   8. Hyperlipidemia - stable 272.4 E78.5   9. Glaucoma - stable 365.9 H40.9   10. Infestation by bed bug 134.8 B88.8    reolved    --cont current  meds as ordered  --OT as ordered. PT/ST as indicated  --f/u with specialists as scheduled  --wound care as ordered  --GOAL: short term rehab followed by long term care. Communicated with pt and nursing.  --will follow  Edgel Degnan S. Ancil Linsey  [facility] [address] [place], [place] [address] [phone] Cell (Monday-Friday 8 AM - 5 PM) [phone] After 5 PM and follow prompts

## 2014-12-02 ENCOUNTER — Encounter (HOSPITAL_BASED_OUTPATIENT_CLINIC_OR_DEPARTMENT_OTHER): Payer: Medicare Other | Attending: Internal Medicine

## 2015-01-03 DIAGNOSIS — M79672 Pain in left foot: Secondary | ICD-10-CM | POA: Diagnosis not present

## 2015-01-03 DIAGNOSIS — M79671 Pain in right foot: Secondary | ICD-10-CM | POA: Diagnosis not present

## 2015-01-03 DIAGNOSIS — B351 Tinea unguium: Secondary | ICD-10-CM | POA: Diagnosis not present

## 2015-01-03 DIAGNOSIS — I739 Peripheral vascular disease, unspecified: Secondary | ICD-10-CM | POA: Diagnosis not present

## 2015-01-04 ENCOUNTER — Non-Acute Institutional Stay (SKILLED_NURSING_FACILITY): Payer: Federal, State, Local not specified - PPO | Admitting: Adult Health

## 2015-01-04 DIAGNOSIS — M179 Osteoarthritis of knee, unspecified: Secondary | ICD-10-CM

## 2015-01-04 DIAGNOSIS — IMO0002 Reserved for concepts with insufficient information to code with codable children: Secondary | ICD-10-CM

## 2015-01-04 DIAGNOSIS — F0390 Unspecified dementia without behavioral disturbance: Secondary | ICD-10-CM

## 2015-01-04 DIAGNOSIS — H409 Unspecified glaucoma: Secondary | ICD-10-CM | POA: Diagnosis not present

## 2015-01-04 DIAGNOSIS — M1710 Unilateral primary osteoarthritis, unspecified knee: Secondary | ICD-10-CM

## 2015-01-04 DIAGNOSIS — E785 Hyperlipidemia, unspecified: Secondary | ICD-10-CM

## 2015-01-04 DIAGNOSIS — I10 Essential (primary) hypertension: Secondary | ICD-10-CM

## 2015-01-04 DIAGNOSIS — E0590 Thyrotoxicosis, unspecified without thyrotoxic crisis or storm: Secondary | ICD-10-CM | POA: Diagnosis not present

## 2015-01-13 ENCOUNTER — Ambulatory Visit: Payer: Federal, State, Local not specified - PPO | Admitting: Internal Medicine

## 2015-01-14 ENCOUNTER — Encounter: Payer: Self-pay | Admitting: Adult Health

## 2015-01-14 NOTE — Progress Notes (Signed)
Patient ID: Melanie Parker, female   DOB: [birth year], 81 y.o.   MRN: [id number]   Facility: [facility]      Allergies  Allergen Reactions  . Ivp Dye [Iodinated Diagnostic Agents] Anaphylaxis  . Naprosyn [Naproxen]     Chief Complaint  Patient presents with  . Medical Management of Chronic Issues    HPI:  She is a long term resident of this facility being seen for the management of her chronic illnesses.  Her status remains without significant change. She is unable to fully participate in the hpi or ros; but tells me that she is feeling good. Her blood pressure is elevated and will require her medications to be adjusted.    Past Medical History  Diagnosis Date  . High blood pressure   . High cholesterol   . Overactive thyroid gland   . Cataract, right   . Mild dementia   . Arthritis   . Spinal stenosis   . Carpal tunnel syndrome   . History of recurrent UTI (urinary tract infection)   . Glaucoma     Past Surgical History  Procedure Laterality Date  . Carpal tunnel release    . Cataract extraction  2000    VITAL SIGNS BP 185/91 mmHg  Pulse 65  Ht  (1.6 m)  Wt 131 lb (59.421 kg)  BMI 23.21 kg/m2  SpO2 96%  Patient's Medications  New Prescriptions   No medications on file  Previous Medications   AMINO ACIDS-PROTEIN HYDROLYS (FEEDING SUPPLEMENT, PRO-STAT SUGAR FREE 64,) LIQD    Take 30 mLs by mouth 2 (two) times daily.   AMLODIPINE (NORVASC) 10 MG TABLET    Take 10 mg by mouth daily. For blood pressure   ATORVASTATIN (LIPITOR) 10 MG TABLET    Take 10 mg by mouth daily. For high  cholesterol   BRIMONIDINE (ALPHAGAN) 0.15 % OPHTHALMIC SOLUTION    Place 1 drop into the right eye 3 (three) times daily.    DOCUSATE SODIUM (COLACE) 50 MG CAPSULE    Take 50 mg by mouth every other day. For constipation   DORZOLAMIDE HCL-TIMOLOL MAL OP    Apply to eye. adminster 1 drops in right eye 2 x daily   KRILL OIL PO    Take by mouth.   LATANOPROST (XALATAN)  0.005 % OPHTHALMIC SOLUTION    Place 1 drop into both eyes at bedtime.   LISINOPRIL (PRINIVIL,ZESTRIL) 10 MG TABLET    Take 1 tablet (10 mg total) by mouth daily. For blood pressure   MELOXICAM (MOBIC) 15 MG TABLET    Take 1 tablet (15 mg total) by mouth daily.   METHIMAZOLE (TAPAZOLE) 5 MG TABLET    Take 1 tablet (5 mg total) by mouth daily. For hyperthyroidism   MULTIPLE VITAMINS-MINERALS (DECUBI-VITE) CAPS    Take 2 capsules by mouth daily.   TRAZODONE (DESYREL) 50 MG TABLET    Take 1 tablet (50 mg total) by mouth at bedtime.  Modified Medications   No medications on file  Discontinued Medications   No medications on file     SIGNIFICANT DIAGNOSTIC EXAMS    LABS REVIEWED:   06-03-14: wbc 6.8; hgb 14.3; hct 43.9; mcv 89; plt 217; glucose 108; bun 18; creat 0.90; k+4.0; na++146; liver normal albumin 4.2; chol 154; ldl 84; trig 106; hdl 49; tsh 1.530; free t4: 1.26 10-14-14: glucose 133; bun 17; creat 0.84; k+4.2; na++ 147; liver normal albumin 3.6; tsh 0.798;  free t4: 1.31  Review of Systems Unable to perform ROS: Dementia    Physical Exam Constitutional: No distress.  Frail   Eyes: Conjunctivae are normal.  Neck: Neck supple. No JVD present. No thyromegaly present.  Cardiovascular: Normal rate, regular rhythm and intact distal pulses.   Respiratory: Effort normal and breath sounds normal. No respiratory distress. She has no wheezes.  GI: Soft. Bowel sounds are normal. She exhibits no distension. There is no tenderness.  Musculoskeletal: She exhibits no edema.  Able to move all extremities   Lymphadenopathy:    She has no cervical adenopathy.  Neurological: She is alert.  Skin: Skin is warm and dry. She is not diaphoretic.   Psychiatric: She has a normal mood and affect.       ASSESSMENT/ PLAN:  1. Hypertension: will continue norvasc 10 mg daily and will increase lisinopril to 20 mg dail will check b/p twice daily    2. Dyslipidemia: will continue lipitor 10  mg daily her ldl is 84  3. Osteoarthritis: she is not voicing any complaints at this tie will continue mobic 15 mg daily   4.  Hyperthyroidism: will continue tapazole 5 mg daily; her tsh is 0.798; and free t4: 1.31  5. Dementia: she is presently not taking medications; will not make changes at this time. Will monitor her status.  Her current weight is 131 pounds.   6. Constipation: will continue colace 50 mg every other day  7. Glaucoma: will continue cosopt to right eye twice daily; alphagan to right eye three times daily and xalatan to right eye nightly   8. Tobacco use: does use snuff on a daily basis.     In 2 weeks will check cbc; bmp; lipids       Synthia Innocenteborah Green NP [facility]  Contact [phone] Monday through Friday 8am- 5pm  After hours call [phone]

## 2015-01-23 DIAGNOSIS — I10 Essential (primary) hypertension: Secondary | ICD-10-CM | POA: Diagnosis not present

## 2015-01-23 DIAGNOSIS — E870 Hyperosmolality and hypernatremia: Secondary | ICD-10-CM | POA: Diagnosis not present

## 2015-01-23 DIAGNOSIS — E785 Hyperlipidemia, unspecified: Secondary | ICD-10-CM | POA: Diagnosis not present

## 2015-02-01 DIAGNOSIS — M6281 Muscle weakness (generalized): Secondary | ICD-10-CM | POA: Diagnosis not present

## 2015-02-01 DIAGNOSIS — I10 Essential (primary) hypertension: Secondary | ICD-10-CM | POA: Diagnosis not present

## 2015-02-02 ENCOUNTER — Encounter: Payer: Self-pay | Admitting: Adult Health

## 2015-02-02 ENCOUNTER — Non-Acute Institutional Stay (SKILLED_NURSING_FACILITY): Payer: Medicare Other | Admitting: Adult Health

## 2015-02-02 DIAGNOSIS — E785 Hyperlipidemia, unspecified: Secondary | ICD-10-CM

## 2015-02-02 DIAGNOSIS — Z720 Tobacco use: Secondary | ICD-10-CM | POA: Diagnosis not present

## 2015-02-02 DIAGNOSIS — F0150 Vascular dementia without behavioral disturbance: Secondary | ICD-10-CM | POA: Insufficient documentation

## 2015-02-02 DIAGNOSIS — L89153 Pressure ulcer of sacral region, stage 3: Secondary | ICD-10-CM | POA: Diagnosis not present

## 2015-02-02 DIAGNOSIS — H409 Unspecified glaucoma: Secondary | ICD-10-CM

## 2015-02-02 DIAGNOSIS — I10 Essential (primary) hypertension: Secondary | ICD-10-CM

## 2015-02-02 DIAGNOSIS — M150 Primary generalized (osteo)arthritis: Secondary | ICD-10-CM

## 2015-02-02 DIAGNOSIS — E0590 Thyrotoxicosis, unspecified without thyrotoxic crisis or storm: Secondary | ICD-10-CM

## 2015-02-02 DIAGNOSIS — M159 Polyosteoarthritis, unspecified: Secondary | ICD-10-CM

## 2015-02-02 NOTE — Progress Notes (Signed)
Patient ID: Melanie Parker, female   DOB: 06/09/1923, 81 y.o.   MRN: [id number]    Facility:  [facility]      Allergies  Allergen Reactions  . Ivp Dye [Iodinated Diagnostic Agents] Anaphylaxis  . Naprosyn [Naproxen]     Chief Complaint  Patient presents with  . Medical Management of Chronic Issues    HPI:  She is a long term resident of this facility being seen for the management of her chronic illnesses. Overall there is little change in her status; her current weight is 127 pounds her weight in Oct was 131 pounds. She is unable to fully participate in the hpi or ros; but tells me that she is doing well.    Past Medical History  Diagnosis Date  . High blood pressure   . High cholesterol   . Overactive thyroid gland   . Cataract, right   . Mild dementia   . Arthritis   . Spinal stenosis   . Carpal tunnel syndrome   . History of recurrent UTI (urinary tract infection)   . Glaucoma     Past Surgical History  Procedure Laterality Date  . Carpal tunnel release    . Cataract extraction  2000    VITAL SIGNS BP 121/60 mmHg  Pulse 69  Ht 5\' 3"  (1.6 m)  Wt 127 lb (57.607 kg)  BMI 22.50 kg/m2  SpO2 99%  Patient's Medications  New Prescriptions   No medications on file  Previous Medications   AMINO ACIDS-PROTEIN HYDROLYS (FEEDING SUPPLEMENT, PRO-STAT SUGAR FREE 64,) LIQD    Take 30 mLs by mouth 2 (two) times daily.   AMLODIPINE (NORVASC) 10 MG TABLET    Take 10 mg by mouth daily. For blood pressure   ATORVASTATIN (LIPITOR) 10 MG TABLET    Take 10 mg by mouth daily. For high  cholesterol   BRIMONIDINE (ALPHAGAN) 0.15 % OPHTHALMIC SOLUTION    Place 1 drop into the right eye 3 (three) times daily.    DOCUSATE SODIUM (COLACE) 50 MG CAPSULE    Take 50 mg by mouth every other day. For constipation   DORZOLAMIDE HCL-TIMOLOL MAL OP    Apply to eye. adminster 1 drops in right eye 2 x daily   LATANOPROST (XALATAN) 0.005 % OPHTHALMIC SOLUTION    Place 1 drop into both eyes at  bedtime.   LISINOPRIL (PRINIVIL,ZESTRIL) 20 MG TABLET    Take 20 mg by mouth daily.   MELOXICAM (MOBIC) 15 MG TABLET    Take 1 tablet (15 mg total) by mouth daily.   METHIMAZOLE (TAPAZOLE) 5 MG TABLET    Take 1 tablet (5 mg total) by mouth daily. For hyperthyroidism   MULTIPLE VITAMINS-MINERALS (DECUBI-VITE) CAPS    Take 2 capsules by mouth daily.   OMEGA-3 FATTY ACIDS (FISH OIL) 1000 MG CAPS    Take 1,000 mg by mouth 2 (two) times daily.   TRAZODONE (DESYREL) 50 MG TABLET    Take 1 tablet (50 mg total) by mouth at bedtime.  Modified Medications   No medications on file  Discontinued Medications     SIGNIFICANT DIAGNOSTIC EXAMS    LABS REVIEWED:   06-03-14: wbc 6.8; hgb 14.3; hct 43.9; mcv 89; plt 217; glucose 108; bun 18; creat 0.90; k+4.0; na++146; liver normal albumin 4.2; chol 154; ldl 84; trig 106; hdl 49; tsh 1.530; free t4: 1.26 10-14-14: glucose 133; bun 17; creat 0.84; k+4.2; na++ 147; liver normal albumin 3.6; tsh 0.798;  free t4: 1.31  Review of Systems Unable to perform ROS: Dementia    Physical Exam Constitutional: No distress.  Frail   Eyes: Conjunctivae are normal.  Neck: Neck supple. No JVD present. No thyromegaly present.  Cardiovascular: Normal rate, regular rhythm and intact distal pulses.   Respiratory: Effort normal and breath sounds normal. No respiratory distress. She has no wheezes.  GI: Soft. Bowel sounds are normal. She exhibits no distension. There is no tenderness.  Musculoskeletal: She exhibits no edema.  Able to move all extremities   Lymphadenopathy:    She has no cervical adenopathy.  Neurological: She is alert.  Skin: Skin is warm and dry. She is not diaphoretic.   Sacrum stage III: 0.8 x 0.5 x 0.12 cm 80% granulation no signs of infection.  Psychiatric: She has a normal mood and affect.       ASSESSMENT/ PLAN:  1. Hypertension: will continue norvasc 10 mg daily and will continue  lisinopril  20 mg daily will monitor  2.  Dyslipidemia: will continue lipitor 10 mg daily her ldl is 84  3. Osteoarthritis: she is not voicing any complaints at this tie will continue mobic 15 mg daily   4.  Hyperthyroidism: will continue tapazole 5 mg daily; her tsh is 0.798; and free t4: 1.31  5. Dementia: she is presently not taking medications; will not make changes at this time. Will monitor her status.  Her current weight is 127 pounds. Her weight in Oct 2016 was 131 pounds; will continue supplements per facility protocol  6. Constipation: will continue colace 50 mg every other day  7. Glaucoma: will continue cosopt to right eye twice daily; alphagan to right eye three times daily and xalatan to right eye nightly   8. Tobacco use: does use snuff on a daily basis.   9. Stage III sacral ulcer: will continue current plan of care and will monitor her status. There are indications of infection present.    Will check tsh free t4    Synthia Innocent NP [facility]  Contact [phone] Monday through Friday 8am- 5pm  After hours call [phone]

## 2015-02-03 DIAGNOSIS — E039 Hypothyroidism, unspecified: Secondary | ICD-10-CM | POA: Diagnosis not present

## 2015-02-24 DIAGNOSIS — R1310 Dysphagia, unspecified: Secondary | ICD-10-CM | POA: Diagnosis not present

## 2015-02-24 DIAGNOSIS — F0390 Unspecified dementia without behavioral disturbance: Secondary | ICD-10-CM | POA: Diagnosis not present

## 2015-02-27 DIAGNOSIS — R1310 Dysphagia, unspecified: Secondary | ICD-10-CM | POA: Diagnosis not present

## 2015-02-27 DIAGNOSIS — F0390 Unspecified dementia without behavioral disturbance: Secondary | ICD-10-CM | POA: Diagnosis not present

## 2015-03-01 DIAGNOSIS — F0390 Unspecified dementia without behavioral disturbance: Secondary | ICD-10-CM | POA: Diagnosis not present

## 2015-03-01 DIAGNOSIS — R1310 Dysphagia, unspecified: Secondary | ICD-10-CM | POA: Diagnosis not present

## 2015-03-02 DIAGNOSIS — R1310 Dysphagia, unspecified: Secondary | ICD-10-CM | POA: Diagnosis not present

## 2015-03-02 DIAGNOSIS — F0390 Unspecified dementia without behavioral disturbance: Secondary | ICD-10-CM | POA: Diagnosis not present

## 2015-03-03 DIAGNOSIS — F0390 Unspecified dementia without behavioral disturbance: Secondary | ICD-10-CM | POA: Diagnosis not present

## 2015-03-03 DIAGNOSIS — R1310 Dysphagia, unspecified: Secondary | ICD-10-CM | POA: Diagnosis not present

## 2015-03-05 DIAGNOSIS — F0390 Unspecified dementia without behavioral disturbance: Secondary | ICD-10-CM | POA: Diagnosis not present

## 2015-03-05 DIAGNOSIS — R1310 Dysphagia, unspecified: Secondary | ICD-10-CM | POA: Diagnosis not present

## 2015-03-06 ENCOUNTER — Encounter: Payer: Self-pay | Admitting: Internal Medicine

## 2015-03-06 ENCOUNTER — Non-Acute Institutional Stay (SKILLED_NURSING_FACILITY): Payer: Medicare Other | Admitting: Internal Medicine

## 2015-03-06 DIAGNOSIS — F0150 Vascular dementia without behavioral disturbance: Secondary | ICD-10-CM | POA: Diagnosis not present

## 2015-03-06 DIAGNOSIS — I10 Essential (primary) hypertension: Secondary | ICD-10-CM

## 2015-03-06 DIAGNOSIS — E785 Hyperlipidemia, unspecified: Secondary | ICD-10-CM

## 2015-03-06 DIAGNOSIS — R1310 Dysphagia, unspecified: Secondary | ICD-10-CM | POA: Diagnosis not present

## 2015-03-06 DIAGNOSIS — F0390 Unspecified dementia without behavioral disturbance: Secondary | ICD-10-CM | POA: Diagnosis not present

## 2015-03-06 NOTE — Assessment & Plan Note (Signed)
Lisinopril was increased last month to 20 mg daily and BP today appears controlled on norvasc 10 mg and lisinopril 20 mg daily

## 2015-03-06 NOTE — Assessment & Plan Note (Signed)
LDL - 84 on lipitor 10 mg daily;plan - cont current med

## 2015-03-06 NOTE — Progress Notes (Signed)
MRN: [id number] Name: Melanie Parker  Sex: female Age: 81 y.o. DOB: 1923-06-08  [facility] #: [facility] Facility/Room:202 Level Of Care: SNF Provider: Merrilee Seashore D Emergency Contacts: Extended Emergency Contact Information Primary Emergency Contact: Feliciana, Narayan Address: [address] [place], [place] [address] [place] Work Phone: [phone] Mobile Phone: [phone] Relation: Son  Code Status:   Allergies: Ivp dye and Naprosyn  Chief Complaint  Patient presents with  . Acute Visit  . Medical Management of Chronic Issues    HPI: Patient is 81 y.o. female with blindness, HTN, HLD, dementia and h/o recurrent UTI who nursing asked me to see for confusion , onset this weekend on Sunday. Pt is refusing meds Pt is saying her son is going to pick her up today and she needs tickets and after speaking to several staff her know her they say this is unusual for her.No fever, no cough or cold. Pt is blind, identifying to pt helps, not identifying to pt before touching her makes it worse. Pt is also being seen for routine issues of HTN and HLD.  Past Medical History  Diagnosis Date  . High blood pressure   . High cholesterol   . Overactive thyroid gland   . Cataract, right   . Mild dementia   . Arthritis   . Spinal stenosis   . Carpal tunnel syndrome   . History of recurrent UTI (urinary tract infection)   . Glaucoma     Past Surgical History  Procedure Laterality Date  . Carpal tunnel release    . Cataract extraction  2000      Medication List       This list is accurate as of: 03/06/15  2:27 PM.  Always use your most recent med list.               amLODipine 10 MG tablet  Commonly known as:  NORVASC  Take 10 mg by mouth daily. For blood pressure     atorvastatin 10 MG tablet  Commonly known as:  LIPITOR  Take 10 mg by mouth daily. For high  cholesterol     brimonidine 0.15 % ophthalmic solution  Commonly known as:  ALPHAGAN   Place 1 drop into the right eye 3 (three) times daily.     DECUBI-VITE Caps  Take 2 capsules by mouth daily.     docusate sodium 50 MG capsule  Commonly known as:  COLACE  Take 50 mg by mouth every other day. For constipation     DORZOLAMIDE HCL-TIMOLOL MAL OP  Apply to eye. adminster 1 drops in right eye 2 x daily     feeding supplement (PRO-STAT SUGAR FREE 64) Liqd  Take 30 mLs by mouth 2 (two) times daily.     Fish Oil 1000 MG Caps  Take 1,000 mg by mouth 2 (two) times daily.     latanoprost 0.005 % ophthalmic solution  Commonly known as:  XALATAN  Place 1 drop into both eyes at bedtime.     lisinopril 20 MG tablet  Commonly known as:  PRINIVIL,ZESTRIL  Take 20 mg by mouth daily.     meloxicam 15 MG tablet  Commonly known as:  MOBIC  Take 1 tablet (15 mg total) by mouth daily.     methimazole 5 MG tablet  Commonly known as:  TAPAZOLE  Take 1 tablet (5 mg total) by mouth daily. For hyperthyroidism  traZODone 50 MG tablet  Commonly known as:  DESYREL  Take 1 tablet (50 mg total) by mouth at bedtime.        No orders of the defined types were placed in this encounter.    Immunization History  Administered Date(s) Administered  . Influenza,inj,Quad PF,36+ Mos 01/11/2014  . Pneumococcal-Unspecified 12/01/2012    Social History  Substance Use Topics  . Smoking status: Never Smoker   . Smokeless tobacco: Current User    Types: Snuff  . Alcohol Use: No    Review of Systems  DATA OBTAINED: from patient, nurse- confusion more than usual GENERAL:  no fevers, fatigue, appetite changes SKIN: No itching, rash HEENT: No complaint RESPIRATORY: No cough, wheezing, SOB CARDIAC: No chest pain, palpitations, lower extremity edema  GI: No abdominal pain, No N/V/D or constipation, No heartburn or reflux  GU: No dysuria, frequency or urgency, or incontinence  MUSCULOSKELETAL: No unrelieved bone/joint pain NEUROLOGIC: No headache, dizziness  PSYCHIATRIC: No overt  anxiety or sadness  Filed Vitals:   03/06/15 1409  BP: 132/80  Pulse: 61  Temp: 98.2 F (36.8 C)  Resp: 18    Physical Exam  GENERAL APPEARANCE: Alert, conversant, No acute distress  SKIN: No diaphoresis rash HEENT: Unremarkable x pt is blind RESPIRATORY: Breathing is even, unlabored. Lung sounds are clear   CARDIOVASCULAR: Heart RRR no murmurs, rubs or gallops. No peripheral edema  GASTROINTESTINAL: Abdomen is soft, non-tender, not distended w/ normal bowel sounds.  GENITOURINARY: Bladder non tender, not distended  MUSCULOSKELETAL: No abnormal joints or musculature NEUROLOGIC: Cranial nerves 2-12 grossly intact. Moves all extremities PSYCHIATRIC: pt is asking about son picking her up and her need for tickets which is unusual for her even with her dementia  Patient Active Problem List   Diagnosis Date Noted  . Vascular dementia without behavioral disturbance 02/02/2015  . Stage III pressure ulcer of sacral region (HCC) 11/24/2014  . Tobacco use 11/24/2014  . Osteoarthritis of multiple joints 07/15/2014  . Urinary incontinence in female 02/09/2014  . PAD (peripheral artery disease) (HCC) 02/09/2014  . Insomnia 01/11/2014  . Cold intolerance 01/11/2014  . Hyperthyroidism 01/11/2014  . Osteoarthrosis, unspecified whether generalized or localized, involving lower leg 01/11/2014  . CN (constipation) 01/11/2014  . Glaucoma 01/11/2014  . Essential hypertension 01/11/2014  . Hyperlipidemia 01/11/2014    CBC    Component Value Date/Time   WBC 6.8 06/03/2014 1142   RBC 4.91 06/03/2014 1142   HGB 14.3 06/03/2014 1142   HCT 43.9 06/03/2014 1142   PLT 217 06/03/2014 1142   MCV 89 06/03/2014 1142   LYMPHSABS 2.6 06/03/2014 1142   EOSABS 0.3 06/03/2014 1142   BASOSABS 0.1 06/03/2014 1142    CMP     Component Value Date/Time   NA 144 10/14/2014 1221   K 4.2 10/14/2014 1221   CL 106 10/14/2014 1221   CO2 22 10/14/2014 1221   GLUCOSE 133* 10/14/2014 1221   BUN 17  10/14/2014 1221   CREATININE 0.84 10/14/2014 1221   CALCIUM 8.9 10/14/2014 1221   PROT 6.3 10/14/2014 1221   ALBUMIN 3.6 10/14/2014 1221   AST 12 10/14/2014 1221   ALT 9 10/14/2014 1221   ALKPHOS 75 10/14/2014 1221   BILITOT 0.3 10/14/2014 1221   BILITOT 0.5 01/11/2014 1254   GFRNONAA 62 10/14/2014 1221   GFRAA 72 10/14/2014 1221    Assessment and Plan  Vascular dementia without behavioral disturbance Pt reported more confused for 2 days, with h/o recurrent UTI-order U/A  with C and S, CBC, BMP  Essential hypertension Lisinopril was increased last month to 20 mg daily and BP today appears controlled on norvasc 10 mg and lisinopril 20 mg daily  Hyperlipidemia LDL - 84 on lipitor 10 mg daily;plan - cont current med    Margit Hanks, MD

## 2015-03-06 NOTE — Assessment & Plan Note (Signed)
Pt reported more confused for 2 days, with h/o recurrent UTI-order U/A with C and S, CBC, BMP

## 2015-03-07 DIAGNOSIS — I739 Peripheral vascular disease, unspecified: Secondary | ICD-10-CM | POA: Diagnosis not present

## 2015-03-07 DIAGNOSIS — I10 Essential (primary) hypertension: Secondary | ICD-10-CM | POA: Diagnosis not present

## 2015-03-07 LAB — BASIC METABOLIC PANEL
BUN: 21 mg/dL (ref 4–21)
CREATININE: 0.7 mg/dL (ref 0.5–1.1)
GLUCOSE: 87 mg/dL
Potassium: 4.1 mmol/L (ref 3.4–5.3)
SODIUM: 144 mmol/L (ref 137–147)

## 2015-03-07 LAB — CBC AND DIFFERENTIAL
HCT: 36 % (ref 36–46)
HEMOGLOBIN: 10.9 g/dL — AB (ref 12.0–16.0)
Platelets: 180 10*3/uL (ref 150–399)
WBC: 7.1 10^3/mL

## 2015-03-08 DIAGNOSIS — R1310 Dysphagia, unspecified: Secondary | ICD-10-CM | POA: Diagnosis not present

## 2015-03-08 DIAGNOSIS — F0390 Unspecified dementia without behavioral disturbance: Secondary | ICD-10-CM | POA: Diagnosis not present

## 2015-03-09 DIAGNOSIS — R1310 Dysphagia, unspecified: Secondary | ICD-10-CM | POA: Diagnosis not present

## 2015-03-09 DIAGNOSIS — F0390 Unspecified dementia without behavioral disturbance: Secondary | ICD-10-CM | POA: Diagnosis not present

## 2015-03-09 DIAGNOSIS — M6281 Muscle weakness (generalized): Secondary | ICD-10-CM | POA: Diagnosis not present

## 2015-03-09 DIAGNOSIS — I739 Peripheral vascular disease, unspecified: Secondary | ICD-10-CM | POA: Diagnosis not present

## 2015-03-09 DIAGNOSIS — N390 Urinary tract infection, site not specified: Secondary | ICD-10-CM | POA: Diagnosis not present

## 2015-03-10 DIAGNOSIS — R1310 Dysphagia, unspecified: Secondary | ICD-10-CM | POA: Diagnosis not present

## 2015-03-10 DIAGNOSIS — F0390 Unspecified dementia without behavioral disturbance: Secondary | ICD-10-CM | POA: Diagnosis not present

## 2015-03-13 DIAGNOSIS — R1310 Dysphagia, unspecified: Secondary | ICD-10-CM | POA: Diagnosis not present

## 2015-03-13 DIAGNOSIS — F0390 Unspecified dementia without behavioral disturbance: Secondary | ICD-10-CM | POA: Diagnosis not present

## 2015-03-14 DIAGNOSIS — F0390 Unspecified dementia without behavioral disturbance: Secondary | ICD-10-CM | POA: Diagnosis not present

## 2015-03-14 DIAGNOSIS — R1310 Dysphagia, unspecified: Secondary | ICD-10-CM | POA: Diagnosis not present

## 2015-03-15 DIAGNOSIS — F0390 Unspecified dementia without behavioral disturbance: Secondary | ICD-10-CM | POA: Diagnosis not present

## 2015-03-15 DIAGNOSIS — R1310 Dysphagia, unspecified: Secondary | ICD-10-CM | POA: Diagnosis not present

## 2015-03-16 DIAGNOSIS — F0390 Unspecified dementia without behavioral disturbance: Secondary | ICD-10-CM | POA: Diagnosis not present

## 2015-03-16 DIAGNOSIS — R1310 Dysphagia, unspecified: Secondary | ICD-10-CM | POA: Diagnosis not present

## 2015-03-17 DIAGNOSIS — R1310 Dysphagia, unspecified: Secondary | ICD-10-CM | POA: Diagnosis not present

## 2015-03-17 DIAGNOSIS — F0390 Unspecified dementia without behavioral disturbance: Secondary | ICD-10-CM | POA: Diagnosis not present

## 2015-03-19 DIAGNOSIS — F0390 Unspecified dementia without behavioral disturbance: Secondary | ICD-10-CM | POA: Diagnosis not present

## 2015-03-19 DIAGNOSIS — R1310 Dysphagia, unspecified: Secondary | ICD-10-CM | POA: Diagnosis not present

## 2015-03-20 DIAGNOSIS — F0390 Unspecified dementia without behavioral disturbance: Secondary | ICD-10-CM | POA: Diagnosis not present

## 2015-03-20 DIAGNOSIS — R1310 Dysphagia, unspecified: Secondary | ICD-10-CM | POA: Diagnosis not present

## 2015-03-21 DIAGNOSIS — F0390 Unspecified dementia without behavioral disturbance: Secondary | ICD-10-CM | POA: Diagnosis not present

## 2015-03-21 DIAGNOSIS — R1310 Dysphagia, unspecified: Secondary | ICD-10-CM | POA: Diagnosis not present

## 2015-03-22 DIAGNOSIS — R1310 Dysphagia, unspecified: Secondary | ICD-10-CM | POA: Diagnosis not present

## 2015-03-22 DIAGNOSIS — F0390 Unspecified dementia without behavioral disturbance: Secondary | ICD-10-CM | POA: Diagnosis not present

## 2015-03-23 DIAGNOSIS — R1310 Dysphagia, unspecified: Secondary | ICD-10-CM | POA: Diagnosis not present

## 2015-03-23 DIAGNOSIS — F0390 Unspecified dementia without behavioral disturbance: Secondary | ICD-10-CM | POA: Diagnosis not present

## 2015-04-03 ENCOUNTER — Non-Acute Institutional Stay (SKILLED_NURSING_FACILITY): Payer: Medicare Other | Admitting: Internal Medicine

## 2015-04-03 DIAGNOSIS — M150 Primary generalized (osteo)arthritis: Secondary | ICD-10-CM | POA: Diagnosis not present

## 2015-04-03 DIAGNOSIS — G4700 Insomnia, unspecified: Secondary | ICD-10-CM | POA: Diagnosis not present

## 2015-04-03 DIAGNOSIS — E0590 Thyrotoxicosis, unspecified without thyrotoxic crisis or storm: Secondary | ICD-10-CM | POA: Diagnosis not present

## 2015-04-03 DIAGNOSIS — M159 Polyosteoarthritis, unspecified: Secondary | ICD-10-CM

## 2015-04-03 NOTE — Progress Notes (Signed)
MRN: [id number] Name: Melanie Parker  Sex: female Age: 82 y.o. DOB: [birth year]  [facility] #: [facility] Facility/Room:202B Level Of Care: SNF Provider: Merrilee Seashore D Emergency Contacts: Extended Emergency Contact Information Primary Emergency Contact: Zamyra, Allensworth Address: [address] [place], [place] [address] [place] Work Phone: [phone] Mobile Phone: [phone] Relation: Son  Code Status:   Allergies: Ivp dye and Naprosyn  Chief Complaint  Patient presents with  . Medical Management of Chronic Issues    HPI: Patient is 82 y.o. female with blindness, HTN, HLD, dementia and h/o recurrent UTI who is being seen for routine issues of hyperthyroidism,OA, and insomnia.  Past Medical History  Diagnosis Date  . High blood pressure   . High cholesterol   . Overactive thyroid gland   . Cataract, right   . Mild dementia   . Arthritis   . Spinal stenosis   . Carpal tunnel syndrome   . History of recurrent UTI (urinary tract infection)   . Glaucoma     Past Surgical History  Procedure Laterality Date  . Carpal tunnel release    . Cataract extraction  2000      Medication List       This list is accurate as of: 04/03/15 11:59 PM.  Always use your most recent med list.               amLODipine 10 MG tablet  Commonly known as:  NORVASC  Take 10 mg by mouth daily. For blood pressure     atorvastatin 10 MG tablet  Commonly known as:  LIPITOR  Take 10 mg by mouth daily. For high  cholesterol     brimonidine 0.15 % ophthalmic solution  Commonly known as:  ALPHAGAN  Place 1 drop into the right eye 3 (three) times daily.     DECUBI-VITE Caps  Take 2 capsules by mouth daily.     docusate sodium 50 MG capsule  Commonly known as:  COLACE  Take 50 mg by mouth every other day. For constipation     DORZOLAMIDE HCL-TIMOLOL MAL OP  Apply to eye. adminster 1 drops in right eye 2 x daily     feeding supplement (PRO-STAT SUGAR FREE  64) Liqd  Take 30 mLs by mouth 2 (two) times daily.     Fish Oil 1000 MG Caps  Take 1,000 mg by mouth 2 (two) times daily.     latanoprost 0.005 % ophthalmic solution  Commonly known as:  XALATAN  Place 1 drop into both eyes at bedtime.     lisinopril 20 MG tablet  Commonly known as:  PRINIVIL,ZESTRIL  Take 20 mg by mouth daily.     meloxicam 15 MG tablet  Commonly known as:  MOBIC  Take 1 tablet (15 mg total) by mouth daily.     methimazole 5 MG tablet  Commonly known as:  TAPAZOLE  Take 1 tablet (5 mg total) by mouth daily. For hyperthyroidism     traZODone 50 MG tablet  Commonly known as:  DESYREL  Take 1 tablet (50 mg total) by mouth at bedtime.        No orders of the defined types were placed in this encounter.    Immunization History  Administered Date(s) Administered  . Influenza,inj,Quad PF,36+ Mos 01/11/2014  . Pneumococcal-Unspecified 12/01/2012    Social History  Substance Use Topics  . Smoking status: Never Smoker   . Smokeless tobacco:  Current User    Types: Snuff  . Alcohol Use: No    Review of Systems UTO 2/2 dementia    Filed Vitals:   04/08/15 1400  BP: 128/74  Pulse: 67  Temp: 98.3 F (36.8 C)  Resp: 18    Physical Exam  GENERAL APPEARANCE: Alert, No acute distress  SKIN: No diaphoresis rash, sacral decubitus dressed HEENT: Unremarkable RESPIRATORY: Breathing is even, unlabored. Lung sounds are clear   CARDIOVASCULAR: Heart RRR no murmurs, rubs or gallops. No peripheral edema  GASTROINTESTINAL: Abdomen is soft, non-tender, not distended w/ normal bowel sounds.  GENITOURINARY: Bladder non tender, not distended  MUSCULOSKELETAL: No abnormal joints or musculature NEUROLOGIC: Cranial nerves 2-12 grossly intact. Moves all extremities PSYCHIATRIC: dementia, no behavioral issues  Patient Active Problem List   Diagnosis Date Noted  . Vascular dementia without behavioral disturbance 02/02/2015  . Stage III pressure ulcer of sacral  region (HCC) 11/24/2014  . Tobacco use 11/24/2014  . Osteoarthritis of multiple joints 07/15/2014  . Urinary incontinence in female 02/09/2014  . PAD (peripheral artery disease) (HCC) 02/09/2014  . Insomnia 01/11/2014  . Cold intolerance 01/11/2014  . Hyperthyroidism 01/11/2014  . Osteoarthrosis, unspecified whether generalized or localized, involving lower leg 01/11/2014  . CN (constipation) 01/11/2014  . Glaucoma 01/11/2014  . Essential hypertension 01/11/2014  . Hyperlipidemia 01/11/2014    CBC    Component Value Date/Time   WBC 6.8 06/03/2014 1142   RBC 4.91 06/03/2014 1142   HGB 14.3 06/03/2014 1142   HCT 43.9 06/03/2014 1142   PLT 217 06/03/2014 1142   MCV 89 06/03/2014 1142   LYMPHSABS 2.6 06/03/2014 1142   EOSABS 0.3 06/03/2014 1142   BASOSABS 0.1 06/03/2014 1142    CMP     Component Value Date/Time   NA 144 10/14/2014 1221   K 4.2 10/14/2014 1221   CL 106 10/14/2014 1221   CO2 22 10/14/2014 1221   GLUCOSE 133* 10/14/2014 1221   BUN 17 10/14/2014 1221   CREATININE 0.84 10/14/2014 1221   CALCIUM 8.9 10/14/2014 1221   PROT 6.3 10/14/2014 1221   ALBUMIN 3.6 10/14/2014 1221   AST 12 10/14/2014 1221   ALT 9 10/14/2014 1221   ALKPHOS 75 10/14/2014 1221   BILITOT 0.3 10/14/2014 1221   BILITOT 0.5 01/11/2014 1254   GFRNONAA 62 10/14/2014 1221   GFRAA 72 10/14/2014 1221    Assessment and Plan  Hyperthyroidism will continue tapazole 5 mg daily; her tsh is 0.798; and free t4: 1.31  Osteoarthritis of multiple joints she is not voicing any complaints at this tie will continue mobic 15 mg daily   Insomnia Not reported as uncontrolled ;plan - cont trazodone 50 mg qHS    Margit HanksALEXANDER, Antoni Stefan D, MD

## 2015-04-08 ENCOUNTER — Encounter: Payer: Self-pay | Admitting: Internal Medicine

## 2015-04-08 NOTE — Assessment & Plan Note (Signed)
Not reported as uncontrolled ;plan - cont trazodone 50 mg qHS

## 2015-04-08 NOTE — Assessment & Plan Note (Signed)
will continue tapazole 5 mg daily; her tsh is 0.798; and free t4: 1.31

## 2015-04-08 NOTE — Assessment & Plan Note (Signed)
she is not voicing any complaints at this tie will continue mobic 15 mg daily

## 2015-04-25 DIAGNOSIS — G4700 Insomnia, unspecified: Secondary | ICD-10-CM | POA: Diagnosis not present

## 2015-04-25 DIAGNOSIS — F411 Generalized anxiety disorder: Secondary | ICD-10-CM | POA: Diagnosis not present

## 2015-04-25 DIAGNOSIS — F0390 Unspecified dementia without behavioral disturbance: Secondary | ICD-10-CM | POA: Diagnosis not present

## 2015-05-03 ENCOUNTER — Non-Acute Institutional Stay (SKILLED_NURSING_FACILITY): Payer: Medicare Other | Admitting: Adult Health

## 2015-05-03 DIAGNOSIS — IMO0002 Reserved for concepts with insufficient information to code with codable children: Secondary | ICD-10-CM

## 2015-05-03 DIAGNOSIS — M179 Osteoarthritis of knee, unspecified: Secondary | ICD-10-CM

## 2015-05-03 DIAGNOSIS — L89153 Pressure ulcer of sacral region, stage 3: Secondary | ICD-10-CM

## 2015-05-03 DIAGNOSIS — F0150 Vascular dementia without behavioral disturbance: Secondary | ICD-10-CM | POA: Diagnosis not present

## 2015-05-03 DIAGNOSIS — E0590 Thyrotoxicosis, unspecified without thyrotoxic crisis or storm: Secondary | ICD-10-CM

## 2015-05-03 DIAGNOSIS — Z720 Tobacco use: Secondary | ICD-10-CM

## 2015-05-03 DIAGNOSIS — E785 Hyperlipidemia, unspecified: Secondary | ICD-10-CM

## 2015-05-03 DIAGNOSIS — I10 Essential (primary) hypertension: Secondary | ICD-10-CM

## 2015-05-03 DIAGNOSIS — M1710 Unilateral primary osteoarthritis, unspecified knee: Secondary | ICD-10-CM

## 2015-05-03 NOTE — Progress Notes (Signed)
Patient ID: Melanie Parker, female   DOB: [birth year], 82 y.o.   MRN: [id number]   Facility: [facility]       Allergies  Allergen Reactions  . Ivp Dye [Iodinated Diagnostic Agents] Anaphylaxis  . Naprosyn [Naproxen]     Chief Complaint  Patient presents with  . Medical Management of Chronic Issues    Follow up    HPI:  She is a long term resident of this facility being seen for the management of her chronic illnesses. Overall her status is stable. She is unable to fully participate in the hpi or ros; but did tell me that she is feeling good. There are no nursing concerns at this time.    Past Medical History  Diagnosis Date  . High blood pressure   . High cholesterol   . Overactive thyroid gland   . Cataract, right   . Mild dementia   . Arthritis   . Spinal stenosis   . Carpal tunnel syndrome   . History of recurrent UTI (urinary tract infection)   . Glaucoma     Past Surgical History  Procedure Laterality Date  . Carpal tunnel release    . Cataract extraction  2000    VITAL SIGNS BP 122/78 mmHg  Pulse 75  Temp(Src) 97.4 F (36.3 C) (Oral)  Resp 18  Ht  (1.6 m)  Wt 125 lb (56.7 kg)  BMI 22.15 kg/m2  SpO2 94%  Patient's Medications  New Prescriptions   No medications on file  Previous Medications   AMINO ACIDS-PROTEIN HYDROLYS (FEEDING SUPPLEMENT, PRO-STAT SUGAR FREE 64,) LIQD    Take 30 mLs by mouth 2 (two) times daily.   AMLODIPINE (NORVASC) 10 MG TABLET    Take 10 mg by mouth daily. For blood pressure   ATORVASTATIN (LIPITOR) 10 MG TABLET    Take 10 mg by mouth daily. For high  cholesterol   BIMATOPROST (LUMIGAN) 0.01 % SOLN    Place 1 drop into both eyes at bedtime.   BRIMONIDINE (ALPHAGAN) 0.15 % OPHTHALMIC SOLUTION    Place 1 drop into the right eye 3 (three) times daily.    DOCUSATE SODIUM (COLACE) 50 MG CAPSULE    Take 50 mg by mouth every other day. For constipation   DORZOLAMIDE HCL-TIMOLOL MAL OP    Apply to eye. adminster 1 drops in right  eye 2 x daily   LISINOPRIL (PRINIVIL,ZESTRIL) 20 MG TABLET    Take 20 mg by mouth daily.   MELOXICAM (MOBIC) 15 MG TABLET    Take 1 tablet (15 mg total) by mouth daily.   METHIMAZOLE (TAPAZOLE) 5 MG TABLET    Take 1 tablet (5 mg total) by mouth daily. For hyperthyroidism   MULTIPLE VITAMINS-MINERALS (DECUBI-VITE) CAPS    Take 2 capsules by mouth daily.   SENNOSIDES-DOCUSATE SODIUM (SENOKOT-S) 8.6-50 MG TABLET    Take 2 tablets by mouth at bedtime.   SERTRALINE (ZOLOFT) 25 MG TABLET    Take 25 mg by mouth daily.   TRAZODONE (DESYREL) 50 MG TABLET    Take 1 tablet (50 mg total) by mouth at bedtime.  Modified Medications   No medications on file  Discontinued Medications     SIGNIFICANT DIAGNOSTIC EXAMS  LABS REVIEWED:   06-03-14: wbc 6.8; hgb 14.3; hct 43.9; mcv 89; plt 217; glucose 108; bun 18; creat 0.90; k+4.0; na++146; liver normal albumin 4.2; chol 154; ldl 84; trig 106; hdl 49; tsh 1.530; free t4: 1.26 10-14-14: glucose 133; bun 17; creat  0.84; k+4.2; na++ 147; liver normal albumin 3.6; tsh 0.798;  free t4: 1.31  03-07-15: wbc 7.1; hgb 10.9; hct 36.0; mcv 87.5; plt 180; glucose 87; bun 21; creat 0.72; k+ 4.1; na++ 141      Review of Systems Unable to perform ROS: Dementia    Physical Exam Constitutional: No distress.  Frail   Eyes: Conjunctivae are normal.  Neck: Neck supple. No JVD present. No thyromegaly present.  Cardiovascular: Normal rate, regular rhythm and intact distal pulses.   Respiratory: Effort normal and breath sounds normal. No respiratory distress. She has no wheezes.  GI: Soft. Bowel sounds are normal. She exhibits no distension. There is no tenderness.  Musculoskeletal: She exhibits no edema.  Able to move all extremities   Lymphadenopathy:    She has no cervical adenopathy.  Neurological: She is alert.  Skin: Skin is warm and dry. She is not diaphoretic.   Sacrum stage III: 0.8 x 0.5 x 0.12 cm 80% granulation no signs of infection.  Psychiatric: She  has a normal mood and affect.       ASSESSMENT/ PLAN:  1. Hypertension: will continue norvasc 10 mg daily and will continue  lisinopril  20 mg daily will monitor  2. Dyslipidemia: will continue lipitor 10 mg daily her ldl is 84  3. Osteoarthritis: she is not voicing any complaints at this tie will continue mobic 15 mg daily   4.  Hyperthyroidism: will continue tapazole 5 mg daily; her tsh is 0.798; and free t4: 1.31  5. Dementia: she is presently not taking medications; will not make changes at this time. Will monitor her status.  Her current weight is 125 pounds. Her weight in Oct 2016 was 131 pounds; will continue supplements per facility protocol  6. Constipation: will continue colace 50 mg every other day  7. Glaucoma: will continue cosopt to right eye twice daily; alphagan to right eye three times daily and xalatan to right eye nightly   8. Tobacco use: does use snuff on a daily basis.   9. Stage III sacral ulcer: will continue current plan of care and will monitor her status. There are indications of infection present.        Synthia Innocent NP [facility]  Contact [phone] Monday through Friday 8am- 5pm  After hours call [phone]

## 2015-05-10 DIAGNOSIS — F0390 Unspecified dementia without behavioral disturbance: Secondary | ICD-10-CM | POA: Diagnosis not present

## 2015-05-10 DIAGNOSIS — G4700 Insomnia, unspecified: Secondary | ICD-10-CM | POA: Diagnosis not present

## 2015-05-10 DIAGNOSIS — F411 Generalized anxiety disorder: Secondary | ICD-10-CM | POA: Diagnosis not present

## 2015-06-02 DIAGNOSIS — H2512 Age-related nuclear cataract, left eye: Secondary | ICD-10-CM | POA: Diagnosis not present

## 2015-06-05 LAB — URINALYSIS W MICROSCOPIC + REFLEX CULTURE
Blood, UA: NEGATIVE
Glucose, Ur: NEGATIVE
Ketones, urine: NEGATIVE
Nitrite, UA: NEGATIVE
PH, URINE: 7
Protein, Ur: NEGATIVE
Specific Gravity, Urine: 1.01
Urobilinogen, UA: NORMAL

## 2015-06-07 ENCOUNTER — Non-Acute Institutional Stay (SKILLED_NURSING_FACILITY): Payer: Medicare Other | Admitting: Adult Health

## 2015-06-07 ENCOUNTER — Encounter: Payer: Self-pay | Admitting: Adult Health

## 2015-06-07 DIAGNOSIS — E785 Hyperlipidemia, unspecified: Secondary | ICD-10-CM

## 2015-06-07 DIAGNOSIS — E0590 Thyrotoxicosis, unspecified without thyrotoxic crisis or storm: Secondary | ICD-10-CM

## 2015-06-07 DIAGNOSIS — H409 Unspecified glaucoma: Secondary | ICD-10-CM

## 2015-06-07 DIAGNOSIS — I10 Essential (primary) hypertension: Secondary | ICD-10-CM | POA: Diagnosis not present

## 2015-06-07 DIAGNOSIS — F0150 Vascular dementia without behavioral disturbance: Secondary | ICD-10-CM

## 2015-06-07 NOTE — Progress Notes (Signed)
Patient ID: Melanie Parker, female   DOB: 01/08/1924, 82 y.o.   MRN: [id number]   Facility:  [facility]       Allergies  Allergen Reactions  . Ivp Dye [Iodinated Diagnostic Agents] Anaphylaxis  . Naprosyn [Naproxen]     Chief Complaint  Patient presents with  . Medical Management of Chronic Issues    Follow up    HPI:  She is a long term resident of this facility being seen for the management of her chronic illnesses. Overall there is little change in her status. She cannot fully participate in the hpi or ros; but did tell me that she is doing well. There are no nursing concerns at this time.    Past Medical History  Diagnosis Date  . High blood pressure   . High cholesterol   . Overactive thyroid gland   . Cataract, right   . Mild dementia   . Arthritis   . Spinal stenosis   . Carpal tunnel syndrome   . History of recurrent UTI (urinary tract infection)   . Glaucoma     Past Surgical History  Procedure Laterality Date  . Carpal tunnel release    . Cataract extraction  2000    VITAL SIGNS BP 118/71 mmHg  Pulse 77  Temp(Src) 97.5 F (36.4 C) (Oral)  Resp 18  Ht 5\' 3"  (1.6 m)  Wt 122 lb 4 oz (55.452 kg)  BMI 21.66 kg/m2  SpO2 95%  Patient's Medications  New Prescriptions   No medications on file  Previous Medications   AMLODIPINE (NORVASC) 10 MG TABLET    Take 10 mg by mouth daily. For blood pressure   ATORVASTATIN (LIPITOR) 10 MG TABLET    Take 10 mg by mouth daily. For high  cholesterol   BIMATOPROST (LUMIGAN) 0.01 % SOLN    Place 1 drop into both eyes at bedtime.   BRIMONIDINE (ALPHAGAN) 0.15 % OPHTHALMIC SOLUTION    Place 1 drop into the right eye 3 (three) times daily.    DOCUSATE SODIUM (COLACE) 50 MG CAPSULE    Take 50 mg by mouth every other day. For constipation   DORZOLAMIDE HCL-TIMOLOL MAL OP    Apply to eye. adminster 1 drops in right eye 2 x daily   LATANOPROST (XALATAN) 0.005 % OPHTHALMIC SOLUTION    Place 1 drop into both eyes at bedtime.   LISINOPRIL (PRINIVIL,ZESTRIL) 20 MG TABLET    Take 20 mg by mouth daily.   MELOXICAM (MOBIC) 15 MG TABLET    Take 1 tablet (15 mg total) by mouth daily.   METHIMAZOLE (TAPAZOLE) 5 MG TABLET    Take 1 tablet (5 mg total) by mouth daily. For hyperthyroidism   MULTIPLE VITAMINS-MINERALS (DECUBI-VITE) CAPS    Take 2 capsules by mouth daily.   SENNOSIDES-DOCUSATE SODIUM (SENOKOT-S) 8.6-50 MG TABLET    Take 2 tablets by mouth at bedtime.   TRAZODONE (DESYREL) 50 MG TABLET    Take 1 tablet (50 mg total) by mouth at bedtime.   UNABLE TO FIND    Med Name: HSG Regular texture, regular consistency House supplement  TID for weight support 2 cal 120cc House supplement w/ meals for caloric support and wound healingl  Modified Medications   No medications on file  Discontinued Medications     SIGNIFICANT DIAGNOSTIC EXAMS  LABS REVIEWED:   06-03-14: wbc 6.8; hgb 14.3; hct 43.9; mcv 89; plt 217; glucose 108; bun 18; creat 0.90; k+4.0; na++146; liver normal albumin 4.2; chol 154;  ldl 84; trig 106; hdl 49; tsh 1.530; free t4: 1.26 10-14-14: glucose 133; bun 17; creat 0.84; k+4.2; na++ 147; liver normal albumin 3.6; tsh 0.798;  free t4: 1.31      Review of Systems Unable to perform ROS: Dementia    Physical Exam Constitutional: No distress.  Frail   Eyes: Conjunctivae are normal.  Neck: Neck supple. No JVD present. No thyromegaly present.  Cardiovascular: Normal rate, regular rhythm and intact distal pulses.   Respiratory: Effort normal and breath sounds normal. No respiratory distress. She has no wheezes.  GI: Soft. Bowel sounds are normal. She exhibits no distension. There is no tenderness.  Musculoskeletal: She exhibits no edema.  Able to move all extremities   Lymphadenopathy:    She has no cervical adenopathy.  Neurological: She is alert.  Skin: Skin is warm and dry. She is not diaphoretic.    Psychiatric: She has a normal mood and affect.       ASSESSMENT/ PLAN:  1. Hypertension:  will continue norvasc 10 mg daily and will continue  lisinopril  20 mg daily will monitor  2. Dyslipidemia: will continue lipitor 10 mg daily her ldl is 84  3. Osteoarthritis: she is not voicing any complaints at this tie will continue mobic 15 mg daily   4.  Hyperthyroidism: will continue tapazole 5 mg daily; her tsh is 0.798; and free t4: 1.31  5. Dementia: she is presently not taking medications; will not make changes at this time. Will monitor her status.  Her current weight is 122 pounds. Her weight in Oct 2016 was 131 pounds; will continue supplements per facility protocol  6. Constipation: will continue colace 50 mg every other day  7. Glaucoma: will continue cosopt to right eye twice daily; alphagan to right eye three times daily and xalatan to right eye nightly   8. Tobacco use: does use snuff on a daily basis.     Synthia Innocent NP [facility]  Contact [phone] Monday through Friday 8am- 5pm  After hours call [phone]

## 2015-06-28 DIAGNOSIS — I10 Essential (primary) hypertension: Secondary | ICD-10-CM | POA: Diagnosis not present

## 2015-07-04 DIAGNOSIS — Z961 Presence of intraocular lens: Secondary | ICD-10-CM | POA: Diagnosis not present

## 2015-07-04 DIAGNOSIS — H2511 Age-related nuclear cataract, right eye: Secondary | ICD-10-CM | POA: Diagnosis not present

## 2015-07-04 DIAGNOSIS — H401134 Primary open-angle glaucoma, bilateral, indeterminate stage: Secondary | ICD-10-CM | POA: Diagnosis not present

## 2015-07-04 DIAGNOSIS — H35023 Exudative retinopathy, bilateral: Secondary | ICD-10-CM | POA: Diagnosis not present

## 2015-07-06 ENCOUNTER — Non-Acute Institutional Stay (SKILLED_NURSING_FACILITY): Payer: Medicare Other | Admitting: Internal Medicine

## 2015-07-06 DIAGNOSIS — H6123 Impacted cerumen, bilateral: Secondary | ICD-10-CM | POA: Diagnosis not present

## 2015-07-06 DIAGNOSIS — I10 Essential (primary) hypertension: Secondary | ICD-10-CM

## 2015-07-06 DIAGNOSIS — M1990 Unspecified osteoarthritis, unspecified site: Secondary | ICD-10-CM | POA: Diagnosis not present

## 2015-07-06 DIAGNOSIS — E785 Hyperlipidemia, unspecified: Secondary | ICD-10-CM | POA: Diagnosis not present

## 2015-07-06 NOTE — Progress Notes (Signed)
MRN: [id number] Name: Melanie SilverCassie Finch  Sex: female Age: 82 y.o. DOB: 10/28/1923  [facility] Facility[facility] Level Of Care: SNF Provider: Merrilee SeashoreALEXANDER, Zakyla Tonche D Emergency Contacts: Extended Emergency Contact Information Primary Emergency Contact: Graciela HusbandsGodette,Monroe Jr Address: [address]          [place], [place] [id number] [place] Work Phone: [phone] Mobile Phone: [phone] Relation: Son  Code Status:   Allergies: Ivp dye and Naprosyn  Chief Complaint  Patient presents with  . Medical Management of Chronic Issues    HPI: Patient is 82 y.o. female who is being seen for routine issues of HTN, HLD and arthritis.  Past Medical History  Diagnosis Date  . High blood pressure   . High cholesterol   . Overactive thyroid gland   . Cataract, right   . Mild dementia   . Arthritis   . Spinal stenosis   . Carpal tunnel syndrome   . History of recurrent UTI (urinary tract infection)   . Glaucoma     Past Surgical History  Procedure Laterality Date  . Carpal tunnel release    . Cataract extraction  2000      Medication List       This list is accurate as of: 07/06/15 11:59 PM.  Always use your most recent med list.               amLODipine 10 MG tablet  Commonly known as:  NORVASC  Take 10 mg by mouth daily. For blood pressure     atorvastatin 10 MG tablet  Commonly known as:  LIPITOR  Take 10 mg by mouth daily. For high  cholesterol     bimatoprost 0.01 % Soln  Commonly known as:  LUMIGAN  Place 1 drop into both eyes at bedtime.     brimonidine 0.15 % ophthalmic solution  Commonly known as:  ALPHAGAN  Place 1 drop into the right eye 3 (three) times daily.     DECUBI-VITE Caps  Take 2 capsules by mouth daily.     docusate sodium 50 MG capsule  Commonly known as:  COLACE  Take 50 mg by mouth every other day. For constipation     DORZOLAMIDE HCL-TIMOLOL MAL OP  Apply to eye. adminster 1 drops in right eye 2 x daily      latanoprost 0.005 % ophthalmic solution  Commonly known as:  XALATAN  Place 1 drop into both eyes at bedtime.     lisinopril 20 MG tablet  Commonly known as:  PRINIVIL,ZESTRIL  Take 20 mg by mouth daily.     meloxicam 15 MG tablet  Commonly known as:  MOBIC  Take 1 tablet (15 mg total) by mouth daily.     methimazole 5 MG tablet  Commonly known as:  TAPAZOLE  Take 1 tablet (5 mg total) by mouth daily. For hyperthyroidism     sennosides-docusate sodium 8.6-50 MG tablet  Commonly known as:  SENOKOT-S  Take 2 tablets by mouth at bedtime.     traZODone 50 MG tablet  Commonly known as:  DESYREL  Take 1 tablet (50 mg total) by mouth at bedtime.     UNABLE TO FIND  Med Name: HSG Regular texture, regular consistency House supplement  TID for weight support 2 cal 120cc House supplement w/ meals for caloric support and wound healingl        No orders of the defined types were placed in this encounter.    Immunization History  Administered Date(s) Administered  . Influenza,inj,Quad PF,36+ Mos 01/11/2014  . Influenza-Unspecified 04/25/2015  . Pneumococcal-Unspecified 12/01/2012    Social History  Substance Use Topics  . Smoking status: Never Smoker   . Smokeless tobacco: Current User    Types: Snuff  . Alcohol Use: No    Review of Systems  UTO 2/2 dementia    Filed Vitals:   07/15/15 1308  BP: 124/67  Pulse: 66  Temp: 98 F (36.7 C)  Resp: 18    Physical Exam  GENERAL APPEARANCE: Alert,  No acute distress  SKIN: No diaphoresis rash HEENT: Unremarkable X wax in ears RESPIRATORY: Breathing is even, unlabored. Lung sounds are clear   CARDIOVASCULAR: Heart RRR no murmurs, rubs or gallops. No peripheral edema  GASTROINTESTINAL: Abdomen is soft, non-tender, not distended w/ normal bowel sounds.  GENITOURINARY: Bladder non tender, not distended  MUSCULOSKELETAL: No abnormal joints or musculature NEUROLOGIC: Cranial nerves 2-12 grossly intact. Moves all  extremities PSYCHIATRIC: demented, no behavioral issues  Patient Active Problem List   Diagnosis Date Noted  . Arthritis 07/15/2015  . Vascular dementia without behavioral disturbance 02/02/2015  . Stage III pressure ulcer of sacral region (HCC) 11/24/2014  . Tobacco use 11/24/2014  . Osteoarthritis of multiple joints 07/15/2014  . Urinary incontinence in female 02/09/2014  . PAD (peripheral artery disease) (HCC) 02/09/2014  . Insomnia 01/11/2014  . Cold intolerance 01/11/2014  . Hyperthyroidism 01/11/2014  . Osteoarthrosis, unspecified whether generalized or localized, involving lower leg 01/11/2014  . CN (constipation) 01/11/2014  . Glaucoma 01/11/2014  . Essential hypertension 01/11/2014  . Hyperlipidemia 01/11/2014    CBC    Component Value Date/Time   WBC 7.1 03/07/2015   RBC 4.91 06/03/2014 1142   HGB 10.9* 03/07/2015   HCT 36 03/07/2015   PLT 180 03/07/2015   MCV 89 06/03/2014 1142   LYMPHSABS 2.6 06/03/2014 1142   EOSABS 0.3 06/03/2014 1142   BASOSABS 0.1 06/03/2014 1142    CMP     Component Value Date/Time   NA 144 03/07/2015   K 4.1 03/07/2015   CL 106 10/14/2014 1221   CO2 22 10/14/2014 1221   GLUCOSE 133* 10/14/2014 1221   BUN 21 03/07/2015   CREATININE 0.7 03/07/2015   CREATININE 0.84 10/14/2014 1221   CALCIUM 8.9 10/14/2014 1221   PROT 6.3 10/14/2014 1221   ALBUMIN 3.6 10/14/2014 1221   AST 12 10/14/2014 1221   ALT 9 10/14/2014 1221   ALKPHOS 75 10/14/2014 1221   BILITOT 0.3 10/14/2014 1221   BILITOT 0.5 01/11/2014 1254   GFRNONAA 62 10/14/2014 1221   GFRAA 72 10/14/2014 1221    Assessment and Plan  Essential hypertension BP well controlled now on lisinopril 20 mg daily and norvasc 10 mg;plan - cont current meds  Hyperlipidemia continue lipitor 10 mg daily her ldl is 84  Arthritis Controlled ; cont mobic 15 mg daily  EAR CERUMIN - start deprox daily for several weaks  Margit Hanks, MD

## 2015-07-15 ENCOUNTER — Encounter: Payer: Self-pay | Admitting: Internal Medicine

## 2015-07-15 DIAGNOSIS — M1990 Unspecified osteoarthritis, unspecified site: Secondary | ICD-10-CM | POA: Insufficient documentation

## 2015-07-15 NOTE — Assessment & Plan Note (Signed)
continue lipitor 10 mg daily her ldl is 84

## 2015-07-15 NOTE — Assessment & Plan Note (Signed)
Controlled ; cont mobic 15 mg daily

## 2015-07-15 NOTE — Assessment & Plan Note (Signed)
BP well controlled now on lisinopril 20 mg daily and norvasc 10 mg;plan - cont current meds

## 2015-07-24 ENCOUNTER — Encounter: Payer: Self-pay | Admitting: Internal Medicine

## 2015-07-24 ENCOUNTER — Non-Acute Institutional Stay: Payer: Medicare Other | Admitting: Internal Medicine

## 2015-07-24 DIAGNOSIS — M150 Primary generalized (osteo)arthritis: Secondary | ICD-10-CM | POA: Diagnosis not present

## 2015-07-24 DIAGNOSIS — G4700 Insomnia, unspecified: Secondary | ICD-10-CM | POA: Diagnosis not present

## 2015-07-24 DIAGNOSIS — H409 Unspecified glaucoma: Secondary | ICD-10-CM

## 2015-07-24 DIAGNOSIS — M159 Polyosteoarthritis, unspecified: Secondary | ICD-10-CM

## 2015-07-24 DIAGNOSIS — I10 Essential (primary) hypertension: Secondary | ICD-10-CM | POA: Diagnosis not present

## 2015-07-24 DIAGNOSIS — I739 Peripheral vascular disease, unspecified: Secondary | ICD-10-CM | POA: Diagnosis not present

## 2015-07-24 DIAGNOSIS — E785 Hyperlipidemia, unspecified: Secondary | ICD-10-CM

## 2015-07-24 DIAGNOSIS — E0590 Thyrotoxicosis, unspecified without thyrotoxic crisis or storm: Secondary | ICD-10-CM

## 2015-07-24 DIAGNOSIS — L89153 Pressure ulcer of sacral region, stage 3: Secondary | ICD-10-CM

## 2015-07-24 DIAGNOSIS — F0150 Vascular dementia without behavioral disturbance: Secondary | ICD-10-CM

## 2015-07-24 NOTE — Progress Notes (Signed)
MRN: [id number] Name: Melanie Parker  Sex: female Age: 82 y.o. DOB: [birth year]  [facility] #: [facility] Facility/Room: Level Of Care: SNF Provider: Merrilee Seashore D Emergency Contacts: Extended Emergency Contact Information Primary Emergency Contact: [name], [name] Address: [address] [place], [place] [address] [place] Work Phone: [phone] Mobile Phone: [phone] Relation: Son  Code Status:   Allergies: Ivp dye and Naprosyn  Chief Complaint  Patient presents with  . Discharge Note    HPI: Patient is 82 y.o. female with HTN,HLD,dementia and OA who is being transferred to [facility].  Past Medical History  Diagnosis Date  . High blood pressure   . High cholesterol   . Overactive thyroid gland   . Cataract, right   . Mild dementia   . Arthritis   . Spinal stenosis   . Carpal tunnel syndrome   . History of recurrent UTI (urinary tract infection)   . Glaucoma     Past Surgical History  Procedure Laterality Date  . Carpal tunnel release    . Cataract extraction  2000      Medication List       This list is accurate as of: 07/24/15 11:34 AM.  Always use your most recent med list.               amLODipine 10 MG tablet  Commonly known as:  NORVASC  Take 10 mg by mouth daily. For blood pressure     atorvastatin 10 MG tablet  Commonly known as:  LIPITOR  Take 10 mg by mouth daily. For high  cholesterol     brimonidine 0.15 % ophthalmic solution  Commonly known as:  ALPHAGAN  Place 1 drop into the right eye 3 (three) times daily.     DECUBI-VITE Caps  Take 2 capsules by mouth daily.     docusate sodium 50 MG capsule  Commonly known as:  COLACE  Take 50 mg by mouth every other day. For constipation     DORZOLAMIDE HCL-TIMOLOL MAL OP  Apply to eye. adminster 1 drops in right eye 2 x daily     latanoprost 0.005 % ophthalmic solution  Commonly known as:  XALATAN  Place 1 drop into both eyes at bedtime.     lisinopril  20 MG tablet  Commonly known as:  PRINIVIL,ZESTRIL  Take 20 mg by mouth daily.     meloxicam 15 MG tablet  Commonly known as:  MOBIC  Take 1 tablet (15 mg total) by mouth daily.     methimazole 5 MG tablet  Commonly known as:  TAPAZOLE  Take 1 tablet (5 mg total) by mouth daily. For hyperthyroidism     sennosides-docusate sodium 8.6-50 MG tablet  Commonly known as:  SENOKOT-S  Take 2 tablets by mouth at bedtime.     traZODone 50 MG tablet  Commonly known as:  DESYREL  Take 1 tablet (50 mg total) by mouth at bedtime.     UNABLE TO FIND  Med Name: HSG Regular texture, regular consistency House supplement  TID for weight support 2 cal 120cc House supplement w/ meals for caloric support and wound healingl        No orders of the defined types were placed in this encounter.    Immunization History  Administered Date(s) Administered  . Influenza,inj,Quad PF,36+ Mos 01/11/2014  . Influenza-Unspecified 04/25/2015  . Pneumococcal-Unspecified 12/01/2012    Social History  Substance Use Topics  .  Smoking status: Never Smoker   . Smokeless tobacco: Current User    Types: Snuff  . Alcohol Use: No    Filed Vitals:   07/24/15 1119  BP: 158/82  Pulse: 77  Temp: 96.5 F (35.8 C)  Resp: 18    Physical Exam  GENERAL APPEARANCE: Alert, conversant. No acute distress.  HEENT: Unremarkable. RESPIRATORY: Breathing is even, unlabored. Lung sounds are clear   CARDIOVASCULAR: Heart RRR no murmurs, rubs or gallops. No peripheral edema.  GASTROINTESTINAL: Abdomen is soft, non-tender, not distended w/ normal bowel sounds.  NEUROLOGIC: Cranial nerves 2-12 grossly intact. Moves all extremities  Patient Active Problem List   Diagnosis Date Noted  . Arthritis 07/15/2015  . Vascular dementia without behavioral disturbance 02/02/2015  . Stage III pressure ulcer of sacral region (HCC) 11/24/2014  . Tobacco use 11/24/2014  . Osteoarthritis of multiple joints 07/15/2014  . Urinary  incontinence in female 02/09/2014  . PAD (peripheral artery disease) (HCC) 02/09/2014  . Insomnia 01/11/2014  . Cold intolerance 01/11/2014  . Hyperthyroidism 01/11/2014  . Osteoarthrosis, unspecified whether generalized or localized, involving lower leg 01/11/2014  . CN (constipation) 01/11/2014  . Glaucoma 01/11/2014  . Essential hypertension 01/11/2014  . Hyperlipidemia 01/11/2014    CBC    Component Value Date/Time   WBC 7.1 03/07/2015   RBC 4.91 06/03/2014 1142   HGB 10.9* 03/07/2015   HCT 36 03/07/2015   PLT 180 03/07/2015   MCV 89 06/03/2014 1142   LYMPHSABS 2.6 06/03/2014 1142   EOSABS 0.3 06/03/2014 1142   BASOSABS 0.1 06/03/2014 1142    CMP     Component Value Date/Time   NA 144 03/07/2015   K 4.1 03/07/2015   CL 106 10/14/2014 1221   CO2 22 10/14/2014 1221   GLUCOSE 133* 10/14/2014 1221   BUN 21 03/07/2015   CREATININE 0.7 03/07/2015   CREATININE 0.84 10/14/2014 1221   CALCIUM 8.9 10/14/2014 1221   PROT 6.3 10/14/2014 1221   ALBUMIN 3.6 10/14/2014 1221   AST 12 10/14/2014 1221   ALT 9 10/14/2014 1221   ALKPHOS 75 10/14/2014 1221   BILITOT 0.3 10/14/2014 1221   BILITOT 0.5 01/11/2014 1254   GFRNONAA 62 10/14/2014 1221   GFRAA 72 10/14/2014 1221    Assessment and Plan  Pt is being transferred to [facility]. Medications have been reconciled.   Time spent > 30 min;> 50% of time with patient was spent reviewing records, labs, tests and studies, counseling and developing plan of care  Margit HanksALEXANDER, Adryan Shin D, MD

## 2015-07-25 DIAGNOSIS — R278 Other lack of coordination: Secondary | ICD-10-CM | POA: Diagnosis not present

## 2015-07-25 DIAGNOSIS — M6281 Muscle weakness (generalized): Secondary | ICD-10-CM | POA: Diagnosis not present

## 2015-07-25 DIAGNOSIS — R41841 Cognitive communication deficit: Secondary | ICD-10-CM | POA: Diagnosis not present

## 2015-07-26 DIAGNOSIS — M6281 Muscle weakness (generalized): Secondary | ICD-10-CM | POA: Diagnosis not present

## 2015-07-26 DIAGNOSIS — R41841 Cognitive communication deficit: Secondary | ICD-10-CM | POA: Diagnosis not present

## 2015-07-26 DIAGNOSIS — R278 Other lack of coordination: Secondary | ICD-10-CM | POA: Diagnosis not present

## 2015-07-27 DIAGNOSIS — R278 Other lack of coordination: Secondary | ICD-10-CM | POA: Diagnosis not present

## 2015-07-27 DIAGNOSIS — M6281 Muscle weakness (generalized): Secondary | ICD-10-CM | POA: Diagnosis not present

## 2015-07-27 DIAGNOSIS — R41841 Cognitive communication deficit: Secondary | ICD-10-CM | POA: Diagnosis not present

## 2015-07-28 DIAGNOSIS — R41841 Cognitive communication deficit: Secondary | ICD-10-CM | POA: Diagnosis not present

## 2015-07-28 DIAGNOSIS — R278 Other lack of coordination: Secondary | ICD-10-CM | POA: Diagnosis not present

## 2015-07-28 DIAGNOSIS — M6281 Muscle weakness (generalized): Secondary | ICD-10-CM | POA: Diagnosis not present

## 2015-07-30 DIAGNOSIS — K5909 Other constipation: Secondary | ICD-10-CM | POA: Diagnosis not present

## 2015-07-30 DIAGNOSIS — E038 Other specified hypothyroidism: Secondary | ICD-10-CM | POA: Diagnosis not present

## 2015-07-30 DIAGNOSIS — G308 Other Alzheimer's disease: Secondary | ICD-10-CM | POA: Diagnosis not present

## 2015-07-30 DIAGNOSIS — H4089 Other specified glaucoma: Secondary | ICD-10-CM | POA: Diagnosis not present

## 2015-07-30 DIAGNOSIS — R1084 Generalized abdominal pain: Secondary | ICD-10-CM | POA: Diagnosis not present

## 2015-07-30 DIAGNOSIS — M545 Low back pain: Secondary | ICD-10-CM | POA: Diagnosis not present

## 2015-07-30 DIAGNOSIS — R600 Localized edema: Secondary | ICD-10-CM | POA: Diagnosis not present

## 2015-07-31 DIAGNOSIS — R41841 Cognitive communication deficit: Secondary | ICD-10-CM | POA: Diagnosis not present

## 2015-07-31 DIAGNOSIS — R278 Other lack of coordination: Secondary | ICD-10-CM | POA: Diagnosis not present

## 2015-07-31 DIAGNOSIS — M6281 Muscle weakness (generalized): Secondary | ICD-10-CM | POA: Diagnosis not present

## 2015-08-01 DIAGNOSIS — R41841 Cognitive communication deficit: Secondary | ICD-10-CM | POA: Diagnosis not present

## 2015-08-01 DIAGNOSIS — M6281 Muscle weakness (generalized): Secondary | ICD-10-CM | POA: Diagnosis not present

## 2015-08-01 DIAGNOSIS — R278 Other lack of coordination: Secondary | ICD-10-CM | POA: Diagnosis not present

## 2015-08-02 DIAGNOSIS — M6281 Muscle weakness (generalized): Secondary | ICD-10-CM | POA: Diagnosis not present

## 2015-08-02 DIAGNOSIS — R41841 Cognitive communication deficit: Secondary | ICD-10-CM | POA: Diagnosis not present

## 2015-08-02 DIAGNOSIS — R278 Other lack of coordination: Secondary | ICD-10-CM | POA: Diagnosis not present

## 2015-08-03 DIAGNOSIS — M6281 Muscle weakness (generalized): Secondary | ICD-10-CM | POA: Diagnosis not present

## 2015-08-03 DIAGNOSIS — R278 Other lack of coordination: Secondary | ICD-10-CM | POA: Diagnosis not present

## 2015-08-03 DIAGNOSIS — R41841 Cognitive communication deficit: Secondary | ICD-10-CM | POA: Diagnosis not present

## 2015-08-04 DIAGNOSIS — R41841 Cognitive communication deficit: Secondary | ICD-10-CM | POA: Diagnosis not present

## 2015-08-04 DIAGNOSIS — R278 Other lack of coordination: Secondary | ICD-10-CM | POA: Diagnosis not present

## 2015-08-04 DIAGNOSIS — M6281 Muscle weakness (generalized): Secondary | ICD-10-CM | POA: Diagnosis not present

## 2015-08-07 DIAGNOSIS — R278 Other lack of coordination: Secondary | ICD-10-CM | POA: Diagnosis not present

## 2015-08-07 DIAGNOSIS — M6281 Muscle weakness (generalized): Secondary | ICD-10-CM | POA: Diagnosis not present

## 2015-08-07 DIAGNOSIS — R41841 Cognitive communication deficit: Secondary | ICD-10-CM | POA: Diagnosis not present

## 2015-08-08 DIAGNOSIS — R41841 Cognitive communication deficit: Secondary | ICD-10-CM | POA: Diagnosis not present

## 2015-08-08 DIAGNOSIS — R278 Other lack of coordination: Secondary | ICD-10-CM | POA: Diagnosis not present

## 2015-08-08 DIAGNOSIS — M6281 Muscle weakness (generalized): Secondary | ICD-10-CM | POA: Diagnosis not present

## 2015-08-09 DIAGNOSIS — M6281 Muscle weakness (generalized): Secondary | ICD-10-CM | POA: Diagnosis not present

## 2015-08-09 DIAGNOSIS — R41841 Cognitive communication deficit: Secondary | ICD-10-CM | POA: Diagnosis not present

## 2015-08-09 DIAGNOSIS — R278 Other lack of coordination: Secondary | ICD-10-CM | POA: Diagnosis not present

## 2015-08-10 DIAGNOSIS — M6281 Muscle weakness (generalized): Secondary | ICD-10-CM | POA: Diagnosis not present

## 2015-08-10 DIAGNOSIS — R278 Other lack of coordination: Secondary | ICD-10-CM | POA: Diagnosis not present

## 2015-08-10 DIAGNOSIS — R41841 Cognitive communication deficit: Secondary | ICD-10-CM | POA: Diagnosis not present

## 2015-08-11 DIAGNOSIS — R278 Other lack of coordination: Secondary | ICD-10-CM | POA: Diagnosis not present

## 2015-08-11 DIAGNOSIS — R41841 Cognitive communication deficit: Secondary | ICD-10-CM | POA: Diagnosis not present

## 2015-08-11 DIAGNOSIS — M6281 Muscle weakness (generalized): Secondary | ICD-10-CM | POA: Diagnosis not present

## 2015-08-14 DIAGNOSIS — R278 Other lack of coordination: Secondary | ICD-10-CM | POA: Diagnosis not present

## 2015-08-14 DIAGNOSIS — N390 Urinary tract infection, site not specified: Secondary | ICD-10-CM | POA: Diagnosis not present

## 2015-08-14 DIAGNOSIS — Z79899 Other long term (current) drug therapy: Secondary | ICD-10-CM | POA: Diagnosis not present

## 2015-08-14 DIAGNOSIS — M6281 Muscle weakness (generalized): Secondary | ICD-10-CM | POA: Diagnosis not present

## 2015-08-14 DIAGNOSIS — R41841 Cognitive communication deficit: Secondary | ICD-10-CM | POA: Diagnosis not present

## 2015-08-14 DIAGNOSIS — R509 Fever, unspecified: Secondary | ICD-10-CM | POA: Diagnosis not present

## 2015-09-10 DIAGNOSIS — I10 Essential (primary) hypertension: Secondary | ICD-10-CM | POA: Diagnosis not present

## 2015-09-10 DIAGNOSIS — M545 Low back pain: Secondary | ICD-10-CM | POA: Diagnosis not present

## 2015-09-10 DIAGNOSIS — E038 Other specified hypothyroidism: Secondary | ICD-10-CM | POA: Diagnosis not present

## 2015-09-12 DIAGNOSIS — H401133 Primary open-angle glaucoma, bilateral, severe stage: Secondary | ICD-10-CM | POA: Diagnosis not present

## 2015-09-15 DIAGNOSIS — Z79899 Other long term (current) drug therapy: Secondary | ICD-10-CM | POA: Diagnosis not present

## 2015-10-16 DIAGNOSIS — E785 Hyperlipidemia, unspecified: Secondary | ICD-10-CM | POA: Diagnosis not present

## 2015-11-16 DIAGNOSIS — Z79899 Other long term (current) drug therapy: Secondary | ICD-10-CM | POA: Diagnosis not present

## 2015-11-24 DIAGNOSIS — E0590 Thyrotoxicosis, unspecified without thyrotoxic crisis or storm: Secondary | ICD-10-CM | POA: Diagnosis not present

## 2015-12-12 DIAGNOSIS — H40031 Anatomical narrow angle, right eye: Secondary | ICD-10-CM | POA: Diagnosis not present

## 2015-12-18 DIAGNOSIS — Z23 Encounter for immunization: Secondary | ICD-10-CM | POA: Diagnosis not present

## 2016-02-07 DIAGNOSIS — B351 Tinea unguium: Secondary | ICD-10-CM | POA: Diagnosis not present

## 2016-02-07 DIAGNOSIS — I739 Peripheral vascular disease, unspecified: Secondary | ICD-10-CM | POA: Diagnosis not present

## 2016-02-07 DIAGNOSIS — I70203 Unspecified atherosclerosis of native arteries of extremities, bilateral legs: Secondary | ICD-10-CM | POA: Diagnosis not present

## 2016-02-07 DIAGNOSIS — M79674 Pain in right toe(s): Secondary | ICD-10-CM | POA: Diagnosis not present

## 2016-02-27 DIAGNOSIS — N390 Urinary tract infection, site not specified: Secondary | ICD-10-CM | POA: Diagnosis not present

## 2016-03-12 DIAGNOSIS — N390 Urinary tract infection, site not specified: Secondary | ICD-10-CM | POA: Diagnosis not present

## 2016-03-12 DIAGNOSIS — Z87440 Personal history of urinary (tract) infections: Secondary | ICD-10-CM | POA: Diagnosis not present

## 2016-03-12 DIAGNOSIS — M1990 Unspecified osteoarthritis, unspecified site: Secondary | ICD-10-CM | POA: Diagnosis not present

## 2016-03-12 DIAGNOSIS — E785 Hyperlipidemia, unspecified: Secondary | ICD-10-CM | POA: Diagnosis not present

## 2016-03-12 DIAGNOSIS — I10 Essential (primary) hypertension: Secondary | ICD-10-CM | POA: Diagnosis not present

## 2016-03-12 DIAGNOSIS — M4800 Spinal stenosis, site unspecified: Secondary | ICD-10-CM | POA: Diagnosis not present

## 2016-03-12 DIAGNOSIS — M150 Primary generalized (osteo)arthritis: Secondary | ICD-10-CM | POA: Diagnosis not present

## 2016-03-12 DIAGNOSIS — L8990 Pressure ulcer of unspecified site, unspecified stage: Secondary | ICD-10-CM | POA: Diagnosis not present

## 2016-04-08 DIAGNOSIS — H401133 Primary open-angle glaucoma, bilateral, severe stage: Secondary | ICD-10-CM | POA: Diagnosis not present

## 2016-04-14 DIAGNOSIS — I10 Essential (primary) hypertension: Secondary | ICD-10-CM | POA: Diagnosis not present

## 2016-04-14 DIAGNOSIS — H4089 Other specified glaucoma: Secondary | ICD-10-CM | POA: Diagnosis not present

## 2016-04-14 DIAGNOSIS — F418 Other specified anxiety disorders: Secondary | ICD-10-CM | POA: Diagnosis not present

## 2016-04-17 DIAGNOSIS — E785 Hyperlipidemia, unspecified: Secondary | ICD-10-CM | POA: Diagnosis not present

## 2016-05-06 DIAGNOSIS — H40031 Anatomical narrow angle, right eye: Secondary | ICD-10-CM | POA: Diagnosis not present

## 2016-05-14 DIAGNOSIS — K5900 Constipation, unspecified: Secondary | ICD-10-CM | POA: Diagnosis not present

## 2016-05-14 DIAGNOSIS — F0280 Dementia in other diseases classified elsewhere without behavioral disturbance: Secondary | ICD-10-CM | POA: Diagnosis not present

## 2016-05-14 DIAGNOSIS — F0390 Unspecified dementia without behavioral disturbance: Secondary | ICD-10-CM | POA: Diagnosis not present

## 2016-05-14 DIAGNOSIS — H44513 Absolute glaucoma, bilateral: Secondary | ICD-10-CM | POA: Diagnosis not present

## 2016-05-14 DIAGNOSIS — E785 Hyperlipidemia, unspecified: Secondary | ICD-10-CM | POA: Diagnosis not present

## 2016-05-23 DIAGNOSIS — E039 Hypothyroidism, unspecified: Secondary | ICD-10-CM | POA: Diagnosis not present

## 2016-05-23 DIAGNOSIS — Z79899 Other long term (current) drug therapy: Secondary | ICD-10-CM | POA: Diagnosis not present

## 2016-05-23 DIAGNOSIS — H44513 Absolute glaucoma, bilateral: Secondary | ICD-10-CM | POA: Diagnosis not present

## 2016-05-23 DIAGNOSIS — K5900 Constipation, unspecified: Secondary | ICD-10-CM | POA: Diagnosis not present

## 2016-05-23 DIAGNOSIS — F0280 Dementia in other diseases classified elsewhere without behavioral disturbance: Secondary | ICD-10-CM | POA: Diagnosis not present

## 2016-05-23 DIAGNOSIS — F0390 Unspecified dementia without behavioral disturbance: Secondary | ICD-10-CM | POA: Diagnosis not present

## 2016-07-05 DIAGNOSIS — K5900 Constipation, unspecified: Secondary | ICD-10-CM | POA: Diagnosis not present

## 2016-07-05 DIAGNOSIS — F0390 Unspecified dementia without behavioral disturbance: Secondary | ICD-10-CM | POA: Diagnosis not present

## 2016-07-05 DIAGNOSIS — H44513 Absolute glaucoma, bilateral: Secondary | ICD-10-CM | POA: Diagnosis not present

## 2016-07-05 DIAGNOSIS — F0280 Dementia in other diseases classified elsewhere without behavioral disturbance: Secondary | ICD-10-CM | POA: Diagnosis not present

## 2016-07-14 DIAGNOSIS — E038 Other specified hypothyroidism: Secondary | ICD-10-CM | POA: Diagnosis not present

## 2016-07-14 DIAGNOSIS — G308 Other Alzheimer's disease: Secondary | ICD-10-CM | POA: Diagnosis not present

## 2016-07-14 DIAGNOSIS — I10 Essential (primary) hypertension: Secondary | ICD-10-CM | POA: Diagnosis not present

## 2016-07-22 DIAGNOSIS — M25561 Pain in right knee: Secondary | ICD-10-CM | POA: Diagnosis not present

## 2016-07-22 DIAGNOSIS — M25562 Pain in left knee: Secondary | ICD-10-CM | POA: Diagnosis not present

## 2016-07-24 DIAGNOSIS — I739 Peripheral vascular disease, unspecified: Secondary | ICD-10-CM | POA: Diagnosis not present

## 2016-07-24 DIAGNOSIS — M6281 Muscle weakness (generalized): Secondary | ICD-10-CM | POA: Diagnosis not present

## 2016-07-24 DIAGNOSIS — B351 Tinea unguium: Secondary | ICD-10-CM | POA: Diagnosis not present

## 2016-08-06 DIAGNOSIS — H401133 Primary open-angle glaucoma, bilateral, severe stage: Secondary | ICD-10-CM | POA: Diagnosis not present

## 2016-08-20 DIAGNOSIS — Z79899 Other long term (current) drug therapy: Secondary | ICD-10-CM | POA: Diagnosis not present

## 2016-10-16 DIAGNOSIS — M79674 Pain in right toe(s): Secondary | ICD-10-CM | POA: Diagnosis not present

## 2016-10-16 DIAGNOSIS — L603 Nail dystrophy: Secondary | ICD-10-CM | POA: Diagnosis not present

## 2016-10-16 DIAGNOSIS — M79675 Pain in left toe(s): Secondary | ICD-10-CM | POA: Diagnosis not present

## 2016-10-16 DIAGNOSIS — B351 Tinea unguium: Secondary | ICD-10-CM | POA: Diagnosis not present

## 2016-10-16 DIAGNOSIS — I739 Peripheral vascular disease, unspecified: Secondary | ICD-10-CM | POA: Diagnosis not present

## 2016-10-17 DIAGNOSIS — E785 Hyperlipidemia, unspecified: Secondary | ICD-10-CM | POA: Diagnosis not present

## 2016-10-31 DIAGNOSIS — H2511 Age-related nuclear cataract, right eye: Secondary | ICD-10-CM | POA: Diagnosis not present

## 2016-10-31 DIAGNOSIS — H401134 Primary open-angle glaucoma, bilateral, indeterminate stage: Secondary | ICD-10-CM | POA: Diagnosis not present

## 2016-10-31 DIAGNOSIS — Z961 Presence of intraocular lens: Secondary | ICD-10-CM | POA: Diagnosis not present

## 2016-11-05 DIAGNOSIS — H401133 Primary open-angle glaucoma, bilateral, severe stage: Secondary | ICD-10-CM | POA: Diagnosis not present

## 2016-11-25 DIAGNOSIS — D649 Anemia, unspecified: Secondary | ICD-10-CM | POA: Diagnosis not present

## 2016-11-25 DIAGNOSIS — E039 Hypothyroidism, unspecified: Secondary | ICD-10-CM | POA: Diagnosis not present

## 2016-11-25 DIAGNOSIS — N19 Unspecified kidney failure: Secondary | ICD-10-CM | POA: Diagnosis not present

## 2016-12-06 DIAGNOSIS — N390 Urinary tract infection, site not specified: Secondary | ICD-10-CM | POA: Diagnosis not present

## 2016-12-06 DIAGNOSIS — R400 Somnolence: Secondary | ICD-10-CM | POA: Diagnosis not present

## 2016-12-06 DIAGNOSIS — I10 Essential (primary) hypertension: Secondary | ICD-10-CM | POA: Diagnosis not present

## 2016-12-06 DIAGNOSIS — E038 Other specified hypothyroidism: Secondary | ICD-10-CM | POA: Diagnosis not present

## 2017-01-10 DIAGNOSIS — M79675 Pain in left toe(s): Secondary | ICD-10-CM | POA: Diagnosis not present

## 2017-01-10 DIAGNOSIS — L603 Nail dystrophy: Secondary | ICD-10-CM | POA: Diagnosis not present

## 2017-01-10 DIAGNOSIS — I739 Peripheral vascular disease, unspecified: Secondary | ICD-10-CM | POA: Diagnosis not present

## 2017-01-10 DIAGNOSIS — L853 Xerosis cutis: Secondary | ICD-10-CM | POA: Diagnosis not present

## 2017-01-10 DIAGNOSIS — B351 Tinea unguium: Secondary | ICD-10-CM | POA: Diagnosis not present

## 2017-01-10 DIAGNOSIS — M79674 Pain in right toe(s): Secondary | ICD-10-CM | POA: Diagnosis not present

## 2017-01-26 DIAGNOSIS — E038 Other specified hypothyroidism: Secondary | ICD-10-CM | POA: Diagnosis not present

## 2017-01-26 DIAGNOSIS — K5909 Other constipation: Secondary | ICD-10-CM | POA: Diagnosis not present

## 2017-01-26 DIAGNOSIS — H6123 Impacted cerumen, bilateral: Secondary | ICD-10-CM | POA: Diagnosis not present

## 2017-01-26 DIAGNOSIS — M545 Low back pain: Secondary | ICD-10-CM | POA: Diagnosis not present

## 2017-01-26 DIAGNOSIS — I10 Essential (primary) hypertension: Secondary | ICD-10-CM | POA: Diagnosis not present

## 2017-01-26 DIAGNOSIS — G309 Alzheimer's disease, unspecified: Secondary | ICD-10-CM | POA: Diagnosis not present

## 2017-02-16 DIAGNOSIS — F0281 Dementia in other diseases classified elsewhere with behavioral disturbance: Secondary | ICD-10-CM | POA: Diagnosis not present

## 2017-02-16 DIAGNOSIS — E038 Other specified hypothyroidism: Secondary | ICD-10-CM | POA: Diagnosis not present

## 2017-02-16 DIAGNOSIS — I10 Essential (primary) hypertension: Secondary | ICD-10-CM | POA: Diagnosis not present

## 2017-03-07 DIAGNOSIS — H401133 Primary open-angle glaucoma, bilateral, severe stage: Secondary | ICD-10-CM | POA: Diagnosis not present

## 2017-04-16 DIAGNOSIS — R262 Difficulty in walking, not elsewhere classified: Secondary | ICD-10-CM | POA: Diagnosis not present

## 2017-04-16 DIAGNOSIS — B351 Tinea unguium: Secondary | ICD-10-CM | POA: Diagnosis not present

## 2017-04-16 DIAGNOSIS — I739 Peripheral vascular disease, unspecified: Secondary | ICD-10-CM | POA: Diagnosis not present

## 2017-05-25 DIAGNOSIS — I10 Essential (primary) hypertension: Secondary | ICD-10-CM | POA: Diagnosis not present

## 2017-05-25 DIAGNOSIS — F062 Psychotic disorder with delusions due to known physiological condition: Secondary | ICD-10-CM | POA: Diagnosis not present

## 2017-06-01 DIAGNOSIS — F0391 Unspecified dementia with behavioral disturbance: Secondary | ICD-10-CM | POA: Diagnosis not present

## 2017-06-25 DIAGNOSIS — R262 Difficulty in walking, not elsewhere classified: Secondary | ICD-10-CM | POA: Diagnosis not present

## 2017-06-25 DIAGNOSIS — I739 Peripheral vascular disease, unspecified: Secondary | ICD-10-CM | POA: Diagnosis not present

## 2017-06-25 DIAGNOSIS — B351 Tinea unguium: Secondary | ICD-10-CM | POA: Diagnosis not present

## 2017-09-05 DIAGNOSIS — Z961 Presence of intraocular lens: Secondary | ICD-10-CM | POA: Diagnosis not present

## 2017-09-05 DIAGNOSIS — H2513 Age-related nuclear cataract, bilateral: Secondary | ICD-10-CM | POA: Diagnosis not present

## 2017-09-05 DIAGNOSIS — H401133 Primary open-angle glaucoma, bilateral, severe stage: Secondary | ICD-10-CM | POA: Diagnosis not present

## 2017-09-10 DIAGNOSIS — F411 Generalized anxiety disorder: Secondary | ICD-10-CM | POA: Diagnosis not present

## 2017-09-10 DIAGNOSIS — F0391 Unspecified dementia with behavioral disturbance: Secondary | ICD-10-CM | POA: Diagnosis not present

## 2017-09-10 DIAGNOSIS — G309 Alzheimer's disease, unspecified: Secondary | ICD-10-CM | POA: Diagnosis not present

## 2017-09-11 DIAGNOSIS — R569 Unspecified convulsions: Secondary | ICD-10-CM | POA: Diagnosis not present

## 2017-09-11 DIAGNOSIS — D649 Anemia, unspecified: Secondary | ICD-10-CM | POA: Diagnosis not present

## 2017-09-11 DIAGNOSIS — F0391 Unspecified dementia with behavioral disturbance: Secondary | ICD-10-CM | POA: Diagnosis not present

## 2017-09-17 DIAGNOSIS — E039 Hypothyroidism, unspecified: Secondary | ICD-10-CM | POA: Diagnosis not present

## 2017-09-17 DIAGNOSIS — D649 Anemia, unspecified: Secondary | ICD-10-CM | POA: Diagnosis not present

## 2017-09-17 DIAGNOSIS — F0390 Unspecified dementia without behavioral disturbance: Secondary | ICD-10-CM | POA: Diagnosis not present

## 2017-09-24 DIAGNOSIS — I739 Peripheral vascular disease, unspecified: Secondary | ICD-10-CM | POA: Diagnosis not present

## 2017-09-24 DIAGNOSIS — B351 Tinea unguium: Secondary | ICD-10-CM | POA: Diagnosis not present

## 2017-09-24 DIAGNOSIS — R262 Difficulty in walking, not elsewhere classified: Secondary | ICD-10-CM | POA: Diagnosis not present

## 2017-10-08 DIAGNOSIS — G309 Alzheimer's disease, unspecified: Secondary | ICD-10-CM | POA: Diagnosis not present

## 2017-10-08 DIAGNOSIS — F0391 Unspecified dementia with behavioral disturbance: Secondary | ICD-10-CM | POA: Diagnosis not present

## 2017-10-08 DIAGNOSIS — F411 Generalized anxiety disorder: Secondary | ICD-10-CM | POA: Diagnosis not present

## 2017-10-22 DIAGNOSIS — G309 Alzheimer's disease, unspecified: Secondary | ICD-10-CM | POA: Diagnosis not present

## 2017-10-22 DIAGNOSIS — F411 Generalized anxiety disorder: Secondary | ICD-10-CM | POA: Diagnosis not present

## 2017-10-22 DIAGNOSIS — F0391 Unspecified dementia with behavioral disturbance: Secondary | ICD-10-CM | POA: Diagnosis not present

## 2017-10-22 DIAGNOSIS — G4700 Insomnia, unspecified: Secondary | ICD-10-CM | POA: Diagnosis not present

## 2017-11-05 DIAGNOSIS — F0391 Unspecified dementia with behavioral disturbance: Secondary | ICD-10-CM | POA: Diagnosis not present

## 2017-11-05 DIAGNOSIS — G309 Alzheimer's disease, unspecified: Secondary | ICD-10-CM | POA: Diagnosis not present

## 2017-11-05 DIAGNOSIS — F411 Generalized anxiety disorder: Secondary | ICD-10-CM | POA: Diagnosis not present

## 2017-11-05 DIAGNOSIS — G4700 Insomnia, unspecified: Secondary | ICD-10-CM | POA: Diagnosis not present

## 2017-11-15 DIAGNOSIS — F0391 Unspecified dementia with behavioral disturbance: Secondary | ICD-10-CM | POA: Diagnosis not present

## 2017-11-27 DIAGNOSIS — I739 Peripheral vascular disease, unspecified: Secondary | ICD-10-CM | POA: Diagnosis not present

## 2017-11-27 DIAGNOSIS — B351 Tinea unguium: Secondary | ICD-10-CM | POA: Diagnosis not present

## 2017-12-07 ENCOUNTER — Emergency Department (HOSPITAL_COMMUNITY): Payer: Medicare Other

## 2017-12-07 ENCOUNTER — Encounter (HOSPITAL_COMMUNITY): Payer: Self-pay

## 2017-12-07 ENCOUNTER — Inpatient Hospital Stay (HOSPITAL_COMMUNITY)
Admission: EM | Admit: 2017-12-07 | Discharge: 2017-12-10 | DRG: 689 | Disposition: A | Discharge status: Skilled Nursing Facility | Payer: Medicare Other | Source: Skilled Nursing Facility | Attending: Internal Medicine | Admitting: Internal Medicine

## 2017-12-07 ENCOUNTER — Other Ambulatory Visit: Payer: Self-pay

## 2017-12-07 DIAGNOSIS — N179 Acute kidney failure, unspecified: Secondary | ICD-10-CM | POA: Diagnosis not present

## 2017-12-07 DIAGNOSIS — I959 Hypotension, unspecified: Secondary | ICD-10-CM | POA: Diagnosis not present

## 2017-12-07 DIAGNOSIS — Z888 Allergy status to other drugs, medicaments and biological substances status: Secondary | ICD-10-CM

## 2017-12-07 DIAGNOSIS — Z87440 Personal history of urinary (tract) infections: Secondary | ICD-10-CM

## 2017-12-07 DIAGNOSIS — E785 Hyperlipidemia, unspecified: Secondary | ICD-10-CM | POA: Diagnosis present

## 2017-12-07 DIAGNOSIS — M549 Dorsalgia, unspecified: Secondary | ICD-10-CM | POA: Diagnosis present

## 2017-12-07 DIAGNOSIS — R55 Syncope and collapse: Secondary | ICD-10-CM | POA: Diagnosis not present

## 2017-12-07 DIAGNOSIS — M1710 Unilateral primary osteoarthritis, unspecified knee: Secondary | ICD-10-CM | POA: Diagnosis present

## 2017-12-07 DIAGNOSIS — F329 Major depressive disorder, single episode, unspecified: Secondary | ICD-10-CM | POA: Diagnosis present

## 2017-12-07 DIAGNOSIS — Z791 Long term (current) use of non-steroidal anti-inflammatories (NSAID): Secondary | ICD-10-CM

## 2017-12-07 DIAGNOSIS — H548 Legal blindness, as defined in USA: Secondary | ICD-10-CM | POA: Diagnosis present

## 2017-12-07 DIAGNOSIS — E0590 Thyrotoxicosis, unspecified without thyrotoxic crisis or storm: Secondary | ICD-10-CM | POA: Diagnosis present

## 2017-12-07 DIAGNOSIS — R0902 Hypoxemia: Secondary | ICD-10-CM | POA: Diagnosis not present

## 2017-12-07 DIAGNOSIS — I739 Peripheral vascular disease, unspecified: Secondary | ICD-10-CM | POA: Diagnosis present

## 2017-12-07 DIAGNOSIS — E860 Dehydration: Secondary | ICD-10-CM | POA: Diagnosis present

## 2017-12-07 DIAGNOSIS — M1990 Unspecified osteoarthritis, unspecified site: Secondary | ICD-10-CM | POA: Diagnosis present

## 2017-12-07 DIAGNOSIS — N390 Urinary tract infection, site not specified: Secondary | ICD-10-CM | POA: Diagnosis not present

## 2017-12-07 DIAGNOSIS — Z993 Dependence on wheelchair: Secondary | ICD-10-CM

## 2017-12-07 DIAGNOSIS — Z79899 Other long term (current) drug therapy: Secondary | ICD-10-CM

## 2017-12-07 DIAGNOSIS — F0390 Unspecified dementia without behavioral disturbance: Secondary | ICD-10-CM | POA: Diagnosis present

## 2017-12-07 DIAGNOSIS — I10 Essential (primary) hypertension: Secondary | ICD-10-CM | POA: Diagnosis present

## 2017-12-07 DIAGNOSIS — H409 Unspecified glaucoma: Secondary | ICD-10-CM | POA: Diagnosis present

## 2017-12-07 DIAGNOSIS — F17290 Nicotine dependence, other tobacco product, uncomplicated: Secondary | ICD-10-CM | POA: Diagnosis present

## 2017-12-07 DIAGNOSIS — E7800 Pure hypercholesterolemia, unspecified: Secondary | ICD-10-CM | POA: Diagnosis present

## 2017-12-07 DIAGNOSIS — Z8261 Family history of arthritis: Secondary | ICD-10-CM

## 2017-12-07 DIAGNOSIS — K5900 Constipation, unspecified: Secondary | ICD-10-CM | POA: Diagnosis present

## 2017-12-07 DIAGNOSIS — E870 Hyperosmolality and hypernatremia: Secondary | ICD-10-CM | POA: Diagnosis not present

## 2017-12-07 DIAGNOSIS — IMO0002 Reserved for concepts with insufficient information to code with codable children: Secondary | ICD-10-CM | POA: Diagnosis present

## 2017-12-07 DIAGNOSIS — L89153 Pressure ulcer of sacral region, stage 3: Secondary | ICD-10-CM | POA: Diagnosis not present

## 2017-12-07 DIAGNOSIS — I248 Other forms of acute ischemic heart disease: Secondary | ICD-10-CM | POA: Diagnosis not present

## 2017-12-07 DIAGNOSIS — E039 Hypothyroidism, unspecified: Secondary | ICD-10-CM | POA: Diagnosis present

## 2017-12-07 DIAGNOSIS — D509 Iron deficiency anemia, unspecified: Secondary | ICD-10-CM | POA: Diagnosis present

## 2017-12-07 DIAGNOSIS — Z9849 Cataract extraction status, unspecified eye: Secondary | ICD-10-CM

## 2017-12-07 DIAGNOSIS — Z7401 Bed confinement status: Secondary | ICD-10-CM

## 2017-12-07 DIAGNOSIS — H9190 Unspecified hearing loss, unspecified ear: Secondary | ICD-10-CM | POA: Diagnosis present

## 2017-12-07 DIAGNOSIS — R4020 Unspecified coma: Secondary | ICD-10-CM | POA: Diagnosis not present

## 2017-12-07 DIAGNOSIS — Z91041 Radiographic dye allergy status: Secondary | ICD-10-CM

## 2017-12-07 DIAGNOSIS — Z8249 Family history of ischemic heart disease and other diseases of the circulatory system: Secondary | ICD-10-CM

## 2017-12-07 DIAGNOSIS — R001 Bradycardia, unspecified: Secondary | ICD-10-CM | POA: Diagnosis present

## 2017-12-07 DIAGNOSIS — R404 Transient alteration of awareness: Secondary | ICD-10-CM | POA: Diagnosis not present

## 2017-12-07 DIAGNOSIS — Z8042 Family history of malignant neoplasm of prostate: Secondary | ICD-10-CM

## 2017-12-07 LAB — CBC
HEMATOCRIT: 40.3 % (ref 36.0–46.0)
Hemoglobin: 12.1 g/dL (ref 12.0–15.0)
MCH: 27.4 pg (ref 26.0–34.0)
MCHC: 30 g/dL (ref 30.0–36.0)
MCV: 91.4 fL (ref 78.0–100.0)
PLATELETS: 231 10*3/uL (ref 150–400)
RBC: 4.41 MIL/uL (ref 3.87–5.11)
RDW: 17.1 % — ABNORMAL HIGH (ref 11.5–15.5)
WBC: 10.3 10*3/uL (ref 4.0–10.5)

## 2017-12-07 LAB — URINALYSIS, ROUTINE W REFLEX MICROSCOPIC
BILIRUBIN URINE: NEGATIVE
Glucose, UA: NEGATIVE mg/dL
KETONES UR: 5 mg/dL — AB
Nitrite: NEGATIVE
Protein, ur: 100 mg/dL — AB
Specific Gravity, Urine: 1.014 (ref 1.005–1.030)
WBC, UA: >50 WBC/hpf — ABNORMAL HIGH (ref 0–5)
pH: 7 (ref 5.0–8.0)

## 2017-12-07 LAB — I-STAT TROPONIN, ED: TROPONIN I, POC: 0.08 ng/mL (ref 0.00–0.08)

## 2017-12-07 LAB — BASIC METABOLIC PANEL
Anion gap: 12 (ref 5–15)
BUN: 23 mg/dL (ref 8–23)
CO2: 23 mmol/L (ref 22–32)
CREATININE: 1.15 mg/dL — AB (ref 0.44–1.00)
Calcium: 9.1 mg/dL (ref 8.9–10.3)
Chloride: 109 mmol/L (ref 98–111)
GFR calc Af Amer: 46 mL/min — ABNORMAL LOW (ref >60)
GFR, EST NON AFRICAN AMERICAN: 40 mL/min — AB (ref >60)
Glucose, Bld: 135 mg/dL — ABNORMAL HIGH (ref 70–99)
POTASSIUM: 3.3 mmol/L — AB (ref 3.5–5.1)
SODIUM: 144 mmol/L (ref 135–145)

## 2017-12-07 LAB — CBG MONITORING, ED: Glucose-Capillary: 114 mg/dL — ABNORMAL HIGH (ref 70–99)

## 2017-12-07 LAB — TROPONIN I: TROPONIN I: 0.06 ng/mL — AB (ref <0.03)

## 2017-12-07 MED ORDER — SODIUM CHLORIDE 0.9 % IV SOLN
1.0000 g | Freq: Once | INTRAVENOUS | Status: AC
  Administered 2017-12-08: 1 g via INTRAVENOUS
  Filled 2017-12-07: qty 10

## 2017-12-07 MED ORDER — SODIUM CHLORIDE 0.9 % IV BOLUS
500.0000 mL | Freq: Once | INTRAVENOUS | Status: AC
  Administered 2017-12-07: 500 mL via INTRAVENOUS

## 2017-12-07 NOTE — ED Provider Notes (Signed)
[facility] EMERGENCY DEPARTMENT Provider Note   CSN: [id number] Arrival date & time: 12/07/17  1842     History   Chief Complaint Chief Complaint  Patient presents with  . Loss of Consciousness    HPI Melanie Parker is a 84 y.o. female.  Patient is a 84 year old female who is brought by EMS from collapse nursing home.  She has a history of dementia, hyperlipidemia and hypertension.  Her son states that he went to visit the patient in the nursing home and found her sitting in a wheelchair not responding.  She was noted to have a low blood pressure with a systolic in the 60s.  She came around after a few minutes and became ultimately back to her baseline mental status.  At baseline she is confused but does answer questions and follow simple commands.  She had no known recent illnesses per the son.  No fevers.  No nausea or vomiting.  No diarrhea.  No cough or chest congestion.  He states this happened once before when she was dehydrated.  Her history is limited due to her dementia.     Past Medical History:  Diagnosis Date  . Arthritis   . Carpal tunnel syndrome   . Cataract, right   . Glaucoma   . High blood pressure   . High cholesterol   . History of recurrent UTI (urinary tract infection)   . Mild dementia   . Overactive thyroid gland   . Spinal stenosis     Patient Active Problem List   Diagnosis Date Noted  . Arthritis 07/15/2015  . Vascular dementia without behavioral disturbance 02/02/2015  . Stage III pressure ulcer of sacral region (HCC) 11/24/2014  . Tobacco use 11/24/2014  . Osteoarthritis of multiple joints 07/15/2014  . Urinary incontinence in female 02/09/2014  . PAD (peripheral artery disease) (HCC) 02/09/2014  . Insomnia 01/11/2014  . Cold intolerance 01/11/2014  . Hyperthyroidism 01/11/2014  . Osteoarthrosis, unspecified whether generalized or localized, involving lower leg 01/11/2014  . CN (constipation) 01/11/2014  . Glaucoma  01/11/2014  . Essential hypertension 01/11/2014  . Hyperlipidemia 01/11/2014    Past Surgical History:  Procedure Laterality Date  . CARPAL TUNNEL RELEASE    . CATARACT EXTRACTION  2000     OB History   None      Home Medications    Prior to Admission medications   Medication Sig Start Date End Date Taking? Authorizing Provider  amLODipine (NORVASC) 10 MG tablet Take 10 mg by mouth daily. For blood pressure    [provider]  atorvastatin (LIPITOR) 10 MG tablet Take 10 mg by mouth daily. For high  cholesterol    [provider]  brimonidine (ALPHAGAN) 0.15 % ophthalmic solution Place 1 drop into the right eye 3 (three) times daily.     [provider]  docusate sodium (COLACE) 50 MG capsule Take 50 mg by mouth every other day. For constipation    [provider]  DORZOLAMIDE HCL-TIMOLOL MAL OP Apply to eye. adminster 1 drops in right eye 2 x daily    [provider]  latanoprost (XALATAN) 0.005 % ophthalmic solution Place 1 drop into both eyes at bedtime.    [provider]  lisinopril (PRINIVIL,ZESTRIL) 20 MG tablet Take 20 mg by mouth daily.    [provider]  meloxicam (MOBIC) 15 MG tablet Take 1 tablet (15 mg total) by mouth daily. 07/15/14   [address], DO  methimazole (TAPAZOLE) 5  MG tablet Take 1 tablet (5 mg total) by mouth daily. For hyperthyroidism 03/14/14   Reed, Tiffany L, DO  Multiple Vitamins-Minerals (DECUBI-VITE) CAPS Take 2 capsules by mouth daily.    [provider]  sennosides-docusate sodium (SENOKOT-S) 8.6-50 MG tablet Take 2 tablets by mouth at bedtime.    [provider]  traZODone (DESYREL) 50 MG tablet Take 1 tablet (50 mg total) by mouth at bedtime. 07/15/14   [address] Boysarter, Monica, DO  UNABLE TO FIND Med Name: HSG Regular texture, regular consistency House supplement  TID for weight support 2 cal 120cc House supplement w/ meals for caloric support and wound healingl     [provider]    Family History Family History  Problem Relation Age of Onset  . Cancer Brother        Prostate   . Arthritis Mother   . Heart disease Mother   . Heart disease Father   . Lung disease Brother     Social History Social History   Tobacco Use  . Smoking status: Never Smoker  . Smokeless tobacco: Current User    Types: Snuff  Substance Use Topics  . Alcohol use: No    Alcohol/week: 0.0 standard drinks  . Drug use: No     Allergies   Ivp dye [iodinated diagnostic agents] and Naprosyn [naproxen]   Review of Systems Review of Systems  Unable to perform ROS: Dementia     Physical Exam Updated Vital Signs BP (!) 148/105   Pulse 79   Temp 98.2 F (36.8 C) (Axillary)   Resp (!) 23   SpO2 100%   Physical Exam  Constitutional: She appears well-developed and well-nourished.  HENT:  Head: Normocephalic and atraumatic.  Eyes: Pupils are equal, round, and reactive to light.  Neck: Normal range of motion. Neck supple.  Cardiovascular: Normal rate, regular rhythm and normal heart sounds.  Pulmonary/Chest: Effort normal and breath sounds normal. No respiratory distress. She has no wheezes. She has no rales. She exhibits no tenderness.  Abdominal: Soft. Bowel sounds are normal. There is no tenderness. There is no rebound and no guarding.  Musculoskeletal: Normal range of motion. She exhibits no edema.  Lymphadenopathy:    She has no cervical adenopathy.  Neurological: She is alert.  Is oriented to person.  Moves all extremities symmetrically without obvious focal deficits.  Will follow simple commands.  Skin: Skin is warm and dry. No rash noted.  Psychiatric: She has a normal mood and affect.     ED Treatments / Results  Labs (all labs ordered are listed, but only abnormal results are displayed) Labs Reviewed  BASIC METABOLIC PANEL - Abnormal; Notable for the following components:      Result Value   Potassium 3.3 (*)    Glucose, Bld 135  (*)    Creatinine, Ser 1.15 (*)    GFR calc non Af Amer 40 (*)    GFR calc Af Amer 46 (*)    All other components within normal limits  CBC - Abnormal; Notable for the following components:   RDW 17.1 (*)    All other components within normal limits  URINALYSIS, ROUTINE W REFLEX MICROSCOPIC - Abnormal; Notable for the following components:   APPearance TURBID (*)    Hgb urine dipstick MODERATE (*)    Ketones, ur 5 (*)    Protein, ur 100 (*)    Leukocytes, UA MODERATE (*)    WBC, UA >50 (*)    Bacteria, UA MANY (*)  All other components within normal limits  TROPONIN I - Abnormal; Notable for the following components:   Troponin I 0.06 (*)    All other components within normal limits  CBG MONITORING, ED - Abnormal; Notable for the following components:   Glucose-Capillary 114 (*)    All other components within normal limits  I-STAT TROPONIN, ED    EKG EKG Interpretation  Date/Time:  Sunday December 07 2017 19:07:09 EDT Ventricular Rate:  88 PR Interval:    QRS Duration: 87 QT Interval:  386 QTC Calculation: 467 R Axis:   57 Text Interpretation:  Sinus rhythm Borderline repolarization abnormality No old tracing to compare Confirmed by Rolan Bucco [phone]) on 12/07/2017 7:39:44 PM   Radiology Dg Chest 2 View  Result Date: 12/07/2017 CLINICAL DATA:  Syncope EXAM: CHEST - 2 VIEW COMPARISON:  None. FINDINGS: Heart and mediastinal contours are within normal limits. No focal opacities or effusions. No acute bony abnormality. IMPRESSION: No active cardiopulmonary disease. Electronically Signed   By: Charlett Nose M.D.   On: 12/07/2017 20:15   Ct Head Wo Contrast  Result Date: 12/07/2017 CLINICAL DATA:  Syncope EXAM: CT HEAD WITHOUT CONTRAST TECHNIQUE: Contiguous axial images were obtained from the base of the skull through the vertex without intravenous contrast. COMPARISON:  None. FINDINGS: Brain: There is atrophy and chronic small vessel disease changes. No acute  intracranial abnormality. Specifically, no hemorrhage, hydrocephalus, mass lesion, acute infarction, or significant intracranial injury. Vascular: No hyperdense vessel or unexpected calcification. Skull: No acute calvarial abnormality. Sinuses/Orbits: Visualized paranasal sinuses and mastoids clear. Orbital soft tissues unremarkable. Other: None IMPRESSION: No acute intracranial abnormality. Atrophy, chronic microvascular disease. Electronically Signed   By: Charlett Nose M.D.   On: 12/07/2017 21:04    Procedures Procedures (including critical care time)  Medications Ordered in ED Medications  sodium chloride 0.9 % bolus 500 mL (0 mLs Intravenous Stopped 12/07/17 2307)     Initial Impression / Assessment and Plan / ED Course  I have reviewed the triage vital signs and the nursing notes.  Pertinent labs & imaging results that were available during my care of the patient were reviewed by me and considered in my medical decision making (see chart for details).     Patient is a 84 year old female who presents from a nursing home with a syncopal episode.  She was unresponsive for several minutes.  Reportedly she was hypotensive by EMS but she has not had any episodes of hypotension here.  She has not complained of any chest pain or shortness of breath although her history is limited due to dementia.  Her EKG does not show any arrhythmias or ischemic changes.  She did have an elevated troponin at 0.06.  A repeat has been ordered.  Her other labs are non-concerning.  She does have evidence of a urinary tract infection.  Her head CT is negative for acute abnormalities.  I do not find any definitive focal deficits.  Chest x-ray is clear without evidence of pneumonia.  She was given IV Rocephin and I have spoke with Dr. Toniann Fail who will admit the patient for further treatment.  Final Clinical Impressions(s) / ED Diagnoses   Final diagnoses:  Urinary tract infection without hematuria, site unspecified    Syncope, unspecified syncope type    ED Discharge Orders    None       Rolan Bucco, MD 12/07/17 2354

## 2017-12-07 NOTE — ED Triage Notes (Signed)
Per ems pt is resident of [facility]. Had syncopal episode today while up in wheel chair. Unresponsive for approx 6 minutes. When pt became responsive, was A&O 2. Baseline is A&OX3

## 2017-12-08 ENCOUNTER — Other Ambulatory Visit: Payer: Self-pay

## 2017-12-08 ENCOUNTER — Encounter (HOSPITAL_COMMUNITY): Payer: Self-pay | Admitting: Internal Medicine

## 2017-12-08 DIAGNOSIS — N390 Urinary tract infection, site not specified: Secondary | ICD-10-CM | POA: Diagnosis present

## 2017-12-08 DIAGNOSIS — Z91041 Radiographic dye allergy status: Secondary | ICD-10-CM | POA: Diagnosis not present

## 2017-12-08 DIAGNOSIS — R55 Syncope and collapse: Secondary | ICD-10-CM

## 2017-12-08 DIAGNOSIS — I959 Hypotension, unspecified: Secondary | ICD-10-CM | POA: Diagnosis present

## 2017-12-08 DIAGNOSIS — I10 Essential (primary) hypertension: Secondary | ICD-10-CM | POA: Diagnosis present

## 2017-12-08 DIAGNOSIS — E860 Dehydration: Secondary | ICD-10-CM | POA: Diagnosis present

## 2017-12-08 DIAGNOSIS — M1990 Unspecified osteoarthritis, unspecified site: Secondary | ICD-10-CM | POA: Diagnosis present

## 2017-12-08 DIAGNOSIS — E039 Hypothyroidism, unspecified: Secondary | ICD-10-CM | POA: Diagnosis present

## 2017-12-08 DIAGNOSIS — E0590 Thyrotoxicosis, unspecified without thyrotoxic crisis or storm: Secondary | ICD-10-CM | POA: Diagnosis present

## 2017-12-08 DIAGNOSIS — I739 Peripheral vascular disease, unspecified: Secondary | ICD-10-CM | POA: Diagnosis present

## 2017-12-08 DIAGNOSIS — I248 Other forms of acute ischemic heart disease: Secondary | ICD-10-CM | POA: Diagnosis present

## 2017-12-08 DIAGNOSIS — I361 Nonrheumatic tricuspid (valve) insufficiency: Secondary | ICD-10-CM | POA: Diagnosis not present

## 2017-12-08 DIAGNOSIS — Z9849 Cataract extraction status, unspecified eye: Secondary | ICD-10-CM | POA: Diagnosis not present

## 2017-12-08 DIAGNOSIS — Z7401 Bed confinement status: Secondary | ICD-10-CM | POA: Diagnosis not present

## 2017-12-08 DIAGNOSIS — H548 Legal blindness, as defined in USA: Secondary | ICD-10-CM | POA: Diagnosis present

## 2017-12-08 DIAGNOSIS — Z993 Dependence on wheelchair: Secondary | ICD-10-CM | POA: Diagnosis not present

## 2017-12-08 DIAGNOSIS — Z87440 Personal history of urinary (tract) infections: Secondary | ICD-10-CM | POA: Diagnosis not present

## 2017-12-08 DIAGNOSIS — Z888 Allergy status to other drugs, medicaments and biological substances status: Secondary | ICD-10-CM | POA: Diagnosis not present

## 2017-12-08 DIAGNOSIS — F0390 Unspecified dementia without behavioral disturbance: Secondary | ICD-10-CM | POA: Diagnosis present

## 2017-12-08 DIAGNOSIS — E785 Hyperlipidemia, unspecified: Secondary | ICD-10-CM | POA: Diagnosis present

## 2017-12-08 DIAGNOSIS — N179 Acute kidney failure, unspecified: Secondary | ICD-10-CM | POA: Diagnosis present

## 2017-12-08 DIAGNOSIS — L89153 Pressure ulcer of sacral region, stage 3: Secondary | ICD-10-CM | POA: Diagnosis present

## 2017-12-08 DIAGNOSIS — H409 Unspecified glaucoma: Secondary | ICD-10-CM | POA: Diagnosis present

## 2017-12-08 DIAGNOSIS — E7800 Pure hypercholesterolemia, unspecified: Secondary | ICD-10-CM | POA: Diagnosis present

## 2017-12-08 DIAGNOSIS — D509 Iron deficiency anemia, unspecified: Secondary | ICD-10-CM | POA: Diagnosis present

## 2017-12-08 DIAGNOSIS — E870 Hyperosmolality and hypernatremia: Secondary | ICD-10-CM | POA: Diagnosis present

## 2017-12-08 LAB — CBC
HEMATOCRIT: 34.6 % — AB (ref 36.0–46.0)
HEMOGLOBIN: 10.7 g/dL — AB (ref 12.0–15.0)
MCH: 27.4 pg (ref 26.0–34.0)
MCHC: 30.9 g/dL (ref 30.0–36.0)
MCV: 88.5 fL (ref 78.0–100.0)
Platelets: 193 10*3/uL (ref 150–400)
RBC: 3.91 MIL/uL (ref 3.87–5.11)
RDW: 17 % — AB (ref 11.5–15.5)
WBC: 8 10*3/uL (ref 4.0–10.5)

## 2017-12-08 LAB — BASIC METABOLIC PANEL
ANION GAP: 8 (ref 5–15)
BUN: 20 mg/dL (ref 8–23)
CO2: 24 mmol/L (ref 22–32)
Calcium: 8.8 mg/dL — ABNORMAL LOW (ref 8.9–10.3)
Chloride: 115 mmol/L — ABNORMAL HIGH (ref 98–111)
Creatinine, Ser: 0.96 mg/dL (ref 0.44–1.00)
GFR calc Af Amer: 58 mL/min — ABNORMAL LOW (ref >60)
GFR, EST NON AFRICAN AMERICAN: 50 mL/min — AB (ref >60)
Glucose, Bld: 91 mg/dL (ref 70–99)
POTASSIUM: 3.8 mmol/L (ref 3.5–5.1)
SODIUM: 147 mmol/L — AB (ref 135–145)

## 2017-12-08 LAB — TROPONIN I
TROPONIN I: 0.1 ng/mL — AB (ref <0.03)
Troponin I: 0.08 ng/mL (ref <0.03)
Troponin I: 0.08 ng/mL (ref <0.03)

## 2017-12-08 LAB — MRSA PCR SCREENING: MRSA by PCR: NEGATIVE

## 2017-12-08 LAB — TSH: TSH: 0.552 u[IU]/mL (ref 0.350–4.500)

## 2017-12-08 LAB — VALPROIC ACID LEVEL: Valproic Acid Lvl: <10 ug/mL — ABNORMAL LOW (ref 50.0–100.0)

## 2017-12-08 MED ORDER — ACETAMINOPHEN 650 MG RE SUPP: 650.0000 mg | Freq: Four times a day (QID) | RECTAL | Status: DC | PRN

## 2017-12-08 MED ORDER — DEXTROSE 5 % IV SOLN
INTRAVENOUS | Status: DC
  Administered 2017-12-08 – 2017-12-09 (×2): via INTRAVENOUS

## 2017-12-08 MED ORDER — DORZOLAMIDE HCL 2 % OP SOLN
1.0000 [drp] | Freq: Two times a day (BID) | OPHTHALMIC | Status: DC
  Administered 2017-12-08 – 2017-12-09 (×5): 1 [drp] via OPHTHALMIC
  Filled 2017-12-08: qty 10

## 2017-12-08 MED ORDER — ACETAMINOPHEN 325 MG PO TABS: 650.0000 mg | ORAL_TABLET | Freq: Four times a day (QID) | ORAL | Status: DC | PRN

## 2017-12-08 MED ORDER — DIVALPROEX SODIUM 125 MG PO CSDR
125.0000 mg | DELAYED_RELEASE_CAPSULE | Freq: Two times a day (BID) | ORAL | Status: DC
  Administered 2017-12-08 – 2017-12-10 (×5): 125 mg via ORAL
  Filled 2017-12-08 (×6): qty 1

## 2017-12-08 MED ORDER — BRIMONIDINE TARTRATE 0.15 % OP SOLN
1.0000 [drp] | Freq: Three times a day (TID) | OPHTHALMIC | Status: DC
  Administered 2017-12-08 – 2017-12-09 (×6): 1 [drp] via OPHTHALMIC
  Filled 2017-12-08: qty 5

## 2017-12-08 MED ORDER — SODIUM CHLORIDE 0.9 % IV SOLN
1.0000 g | INTRAVENOUS | Status: DC
  Administered 2017-12-08 – 2017-12-09 (×2): 1 g via INTRAVENOUS
  Filled 2017-12-08 (×3): qty 10

## 2017-12-08 MED ORDER — ENOXAPARIN SODIUM 40 MG/0.4ML ~~LOC~~ SOLN
40.0000 mg | SUBCUTANEOUS | Status: DC
  Administered 2017-12-08 – 2017-12-10 (×3): 40 mg via SUBCUTANEOUS
  Filled 2017-12-08 (×3): qty 0.4

## 2017-12-08 MED ORDER — DIVALPROEX SODIUM 125 MG PO CSDR
250.0000 mg | DELAYED_RELEASE_CAPSULE | Freq: Every day | ORAL | Status: DC
  Administered 2017-12-08 – 2017-12-09 (×2): 250 mg via ORAL
  Filled 2017-12-08 (×3): qty 2

## 2017-12-08 MED ORDER — DORZOLAMIDE HCL-TIMOLOL MAL 2-0.5 % OP SOLN: 1.0000 [drp] | Freq: Two times a day (BID) | OPHTHALMIC | Status: DC

## 2017-12-08 MED ORDER — ONDANSETRON HCL 4 MG/2ML IJ SOLN: 4.0000 mg | Freq: Four times a day (QID) | INTRAMUSCULAR | Status: DC | PRN

## 2017-12-08 MED ORDER — METHIMAZOLE 5 MG PO TABS
5.0000 mg | ORAL_TABLET | Freq: Every day | ORAL | Status: DC
  Administered 2017-12-08 – 2017-12-10 (×3): 5 mg via ORAL
  Filled 2017-12-08 (×3): qty 1

## 2017-12-08 MED ORDER — ESCITALOPRAM OXALATE 10 MG PO TABS
5.0000 mg | ORAL_TABLET | Freq: Every day | ORAL | Status: DC
  Administered 2017-12-08 – 2017-12-10 (×3): 5 mg via ORAL
  Filled 2017-12-08 (×3): qty 1

## 2017-12-08 MED ORDER — ONDANSETRON HCL 4 MG PO TABS: 4.0000 mg | ORAL_TABLET | Freq: Four times a day (QID) | ORAL | Status: DC | PRN

## 2017-12-08 MED ORDER — TIMOLOL MALEATE 0.5 % OP SOLN
1.0000 [drp] | Freq: Two times a day (BID) | OPHTHALMIC | Status: DC
  Administered 2017-12-08 – 2017-12-09 (×4): 1 [drp] via OPHTHALMIC
  Filled 2017-12-08: qty 5

## 2017-12-08 MED ORDER — HYDRALAZINE HCL 20 MG/ML IJ SOLN: 5.0000 mg | INTRAMUSCULAR | Status: DC | PRN

## 2017-12-08 MED ORDER — LATANOPROST 0.005 % OP SOLN
1.0000 [drp] | Freq: Every day | OPHTHALMIC | Status: DC
  Administered 2017-12-08 – 2017-12-09 (×3): 1 [drp] via OPHTHALMIC
  Filled 2017-12-08: qty 2.5

## 2017-12-08 NOTE — Progress Notes (Signed)
PROGRESS NOTE   Melanie Parker  ZOX:[id number]    DOB: Jun 26, 1923    DOA: 12/07/2017  PCP: Jarome Matin, MD   I have briefly reviewed patients previous medical records in [facility].  Brief Narrative:  84 year old female, resident of [facility], largely bedbound because of arthritis and mobile with a wheelchair, PMH of HTN, HLD, dementia, UTI, hypothyroid, legally blind, presented to [facility] ED 12/07/2017 following a syncopal episode while sitting on her wheelchair.  As per report, SBP at that time was in the 60s and CBGs were not hypoglycemic.  She regained consciousness without intervention.  She was admitted for evaluation and management of syncope, suspected acute lower UTI, hypotension, acute kidney injury.   Assessment & Plan:   Principal Problem:   Syncope Active Problems:   Hyperthyroidism   Osteoarthrosis involving lower leg   Essential hypertension   PAD (peripheral artery disease) (HCC)   Stage III pressure ulcer of sacral region (HCC)   Acute lower UTI   Syncope: Suspect due to hypotension.  SBP in the 60s during the event.  EKG: Sinus rhythm, normal axis, T inversion in inferior lateral leads but no acute changes.  No reported chest pain.  Troponin minimally elevated: 0.06 > 0.08 > 0.10, could be demand ischemia from hypotension, UTI.  Telemetry without arrhythmias.  CT head without acute findings.  Acute lower UTI: Continue IV ceftriaxone pending urine culture results.  Hypotension: Suspect combination of dehydration and antihypertensives.  Antihypertensives held.  Hydrated with IV fluids.  Hypotension resolved.  Essential hypertension: Antihypertensives temporarily held due to presentation with hypotension.  Consider resuming antihypertensives in a.m.  PRN IV hydralazine for now.  Acute kidney injury: Presented with creatinine of 1.15.  Resolved.  Dehydration with hypernatremia: Sodium hass gone up to 147, transition to D5 infusion and follow.  Elevated troponin:  Discussion as above.  Dementia with depression: No active behavioral issues at this time.  Continue Depakote and Lexapro.  Hyperthyroid: Continue Tapazole.  Clinically euthyroid.  Legally blind  Normocytic anemia: Could be dilutional.  Follow CBC in a.m.   DVT prophylaxis: Lovenox Code Status: Full Family Communication: None at bedside Disposition: DC back to SNF pending clinical improvement.   Consultants:  None  Procedures:  None  Antimicrobials:  IV ceftriaxone   Subjective: Patient very poor historian secondary to dementia.  Alert and oriented only to self.  Patient interviewed and examined along with nursing team at bedside.  No pain reported.  Unable to tell much otherwise.  No family at bedside.  ROS: As above.  Objective:  Vitals:   12/08/17 0134 12/08/17 0444 12/08/17 1054 12/08/17 1218  BP: (!) 164/62 (!) 123/58 (!) 121/42 (!) 111/44  Pulse: 73 83 63 (!) 56  Resp:  16  20  Temp: 98.5 F (36.9 C) 98.7 F (37.1 C)  98.8 F (37.1 C)  TempSrc: Oral Oral  Oral  SpO2: 99% 98% 99% 100%  Weight:      Height:        Examination:  General exam: Pleasant elderly female, small built and frail, lying comfortably supine in bed.  Oral mucosa dry. Respiratory system: Poor inspiratory effort but seems clear to auscultation. Respiratory effort normal. Cardiovascular system: S1 & S2 heard, RRR. No JVD, murmurs, rubs, gallops or clicks. No pedal edema.  Telemetry personally reviewed: SB in the 50s-SR. Gastrointestinal system: Abdomen is nondistended, soft and nontender. No organomegaly or masses felt. Normal bowel sounds heard. Central nervous system: Alert and oriented only to  self.  Does not follow instructions. No focal neurological deficits. Extremities: Moves both upper extremities seemingly normally.  Lower extremity at least grade 2 x 5 power. Skin: No rashes, lesions or ulcers Psychiatry: Judgement and insight impaired. Mood & affect appropriate.     Data  Reviewed: I have personally reviewed following labs and imaging studies  CBC: Recent Labs  Lab 12/07/17 1903 12/08/17 0750  WBC 10.3 8.0  HGB 12.1 10.7*  HCT 40.3 34.6*  MCV 91.4 88.5  PLT 231 193   Basic Metabolic Panel: Recent Labs  Lab 12/07/17 1903 12/08/17 0750  NA 144 147*  K 3.3* 3.8  CL 109 115*  CO2 23 24  GLUCOSE 135* 91  BUN 23 20  CREATININE 1.15* 0.96  CALCIUM 9.1 8.8*   Cardiac Enzymes: Recent Labs  Lab 12/07/17 1903 12/08/17 0233 12/08/17 0750  TROPONINI 0.06* 0.08* 0.10*   CBG: Recent Labs  Lab 12/07/17 2024  GLUCAP 114*    Recent Results (from the past 240 hour(s))  MRSA PCR Screening     Status: None   Collection Time: 12/08/17  1:48 AM  Result Value Ref Range Status   MRSA by PCR NEGATIVE NEGATIVE Final    Comment:        The GeneXpert MRSA Assay (FDA approved for NASAL specimens only), is one component of a comprehensive MRSA colonization surveillance program. It is not intended to diagnose MRSA infection nor to guide or monitor treatment for MRSA infections. Performed at [facility], [address] [place], [place] [address]          Radiology Studies: Dg Chest 2 View  Result Date: 12/07/2017 CLINICAL DATA:  Syncope EXAM: CHEST - 2 VIEW COMPARISON:  None. FINDINGS: Heart and mediastinal contours are within normal limits. No focal opacities or effusions. No acute bony abnormality. IMPRESSION: No active cardiopulmonary disease. Electronically Signed   By: Charlett NoseKevin  Dover M.D.   On: 12/07/2017 20:15   Ct Head Wo Contrast  Result Date: 12/07/2017 CLINICAL DATA:  Syncope EXAM: CT HEAD WITHOUT CONTRAST TECHNIQUE: Contiguous axial images were obtained from the base of the skull through the vertex without intravenous contrast. COMPARISON:  None. FINDINGS: Brain: There is atrophy and chronic small vessel disease changes. No acute intracranial abnormality. Specifically, no hemorrhage, hydrocephalus, mass lesion, acute infarction,  or significant intracranial injury. Vascular: No hyperdense vessel or unexpected calcification. Skull: No acute calvarial abnormality. Sinuses/Orbits: Visualized paranasal sinuses and mastoids clear. Orbital soft tissues unremarkable. Other: None IMPRESSION: No acute intracranial abnormality. Atrophy, chronic microvascular disease. Electronically Signed   By: Charlett NoseKevin  Dover M.D.   On: 12/07/2017 21:04        Scheduled Meds: . brimonidine  1 drop Right Eye TID  . divalproex  125 mg Oral BID BM  . divalproex  250 mg Oral QHS  . dorzolamide  1 drop Both Eyes BID  . enoxaparin (LOVENOX) injection  40 mg Subcutaneous Q24H  . escitalopram  5 mg Oral Daily  . latanoprost  1 drop Both Eyes QHS  . methimazole  5 mg Oral Daily  . timolol  1 drop Both Eyes BID   Continuous Infusions: . cefTRIAXone (ROCEPHIN)  IV       LOS: 0 days     Marcellus ScottAnand Anthany Thornhill, MD, FACP, [facility]. [facility] Pager [phone]  If 7PM-7AM, please contact night-coverage [email or web address] Password [email or web address] 12/08/2017, 1:50 PM

## 2017-12-08 NOTE — H&P (Signed)
History and Physical    Melanie Parker RUE:[id number] DOB: 06/15/1923 DOA: 12/07/2017  PCP: Jarome MatinPaterson, Daniel, MD  Patient coming from: Skilled nursing facility.  Chief Complaint: Loss of consciousness.  History obtained from patient's son.  HPI: Melanie Parker is a 84 y.o. female with history of hypertension, legally blind, dementia, hypothyroidism was found to be unresponsive with loss of consciousness by patient's son last evening when patient's son went to check on the patient at the nursing home.  As per the patient's son patient was doing fine as per the patient's nurse few minutes ago.  Patient was sitting on a wheelchair when this incident happened.  Patient is largely bedbound because of arthritis and gets transferred to wheelchair off and on.  As per the report patient's blood pressure was 60 systolic at the nursing facility when this happened.  Per patient's son patient blood sugar was over check but notes no report of any hypoglycemia.  Patient did not complain of any chest pain or shortness of breath nausea vomiting abdominal pain or diarrhea.  Patient regained consciousness without any intervention and was brought to the ER.  ED Course: In the ER patient blood pressure was in the low normal range.  Was given fluid bolus.  EKG shows normal sinus rhythm with troponin initially mildly elevated.  UA shows features concerning for UTI.  Patient admitted for further management of syncope.  Review of Systems: As per HPI, rest all negative.   Past Medical History:  Diagnosis Date  . Arthritis   . Carpal tunnel syndrome   . Cataract, right   . Glaucoma   . High blood pressure   . High cholesterol   . History of recurrent UTI (urinary tract infection)   . Mild dementia   . Overactive thyroid gland   . Spinal stenosis     Past Surgical History:  Procedure Laterality Date  . CARPAL TUNNEL RELEASE    . CATARACT EXTRACTION  2000     reports that she has never smoked. Her  smokeless tobacco use includes snuff. She reports that she does not drink alcohol or use drugs.  Allergies  Allergen Reactions  . Ivp Dye [Iodinated Diagnostic Agents] Anaphylaxis  . Naprosyn [Naproxen]     Family History  Problem Relation Age of Onset  . Arthritis Mother   . Heart disease Mother   . Heart disease Father   . Lung disease Brother   . Cancer Brother        Prostate     Prior to Admission medications   Medication Sig Start Date End Date Taking? Authorizing Provider  amLODipine (NORVASC) 10 MG tablet Take 10 mg by mouth daily. For blood pressure    [provider]  atorvastatin (LIPITOR) 10 MG tablet Take 10 mg by mouth daily. For high  cholesterol    [provider]  brimonidine (ALPHAGAN) 0.15 % ophthalmic solution Place 1 drop into the right eye 3 (three) times daily.     [provider]  docusate sodium (COLACE) 50 MG capsule Take 50 mg by mouth every other day. For constipation    [provider]  DORZOLAMIDE HCL-TIMOLOL MAL OP Apply to eye. adminster 1 drops in right eye 2 x daily    [provider]  latanoprost (XALATAN) 0.005 % ophthalmic solution Place 1 drop into both eyes at bedtime.    [provider]  lisinopril (PRINIVIL,ZESTRIL) 20 MG tablet Take 20 mg by mouth daily.    [provider]  meloxicam (MOBIC) 15 MG tablet Take 1 tablet (15 mg total) by mouth daily. 07/15/14   Kirt Boys, DO  methimazole (TAPAZOLE) 5 MG tablet Take 1 tablet (5 mg total) by mouth daily. For hyperthyroidism 03/14/14   Reed, Tiffany L, DO  Multiple Vitamins-Minerals (DECUBI-VITE) CAPS Take 2 capsules by mouth daily.    [provider]  sennosides-docusate sodium (SENOKOT-S) 8.6-50 MG tablet Take 2 tablets by mouth at bedtime.    [provider]  traZODone (DESYREL) 50 MG tablet Take 1 tablet (50 mg total) by mouth at bedtime. 07/15/14   Kirt Boys, DO  UNABLE TO FIND Med Name: HSG Regular  texture, regular consistency House supplement  TID for weight support 2 cal 120cc House supplement w/ meals for caloric support and wound healingl    [provider]    Physical Exam: Vitals:   12/07/17 2245 12/07/17 2315 12/07/17 2330 12/07/17 2345  BP: (!) 148/105 (!) 156/67 (!) 150/48 (!) 157/71  Pulse: 79 70 82 79  Resp: (!) 23 17 14 13   Temp:      TempSrc:      SpO2: 100% 98% 99% 100%      Constitutional: Moderately built and nourished. Vitals:   12/07/17 2245 12/07/17 2315 12/07/17 2330 12/07/17 2345  BP: (!) 148/105 (!) 156/67 (!) 150/48 (!) 157/71  Pulse: 79 70 82 79  Resp: (!) 23 17 14 13   Temp:      TempSrc:      SpO2: 100% 98% 99% 100%   Eyes: Anicteric no pallor. ENMT: No discharge from the ears eyes nose or mouth. Neck: No mass felt.  No neck rigidity.  No JVD appreciated. Respiratory: No rhonchi or crepitations. Cardiovascular: S1-S2 heard no murmurs appreciated. Abdomen: Soft nontender bowel sounds present. Musculoskeletal: No edema.  No joint effusion. Skin: No rash.  Sacral area is to be reexamined unable to examine due to the position. Neurologic: Alert awake oriented to name and place to recognize her son.  Patient is legally blind moves all his. Psychiatric: Oriented to name and place.   Labs on Admission: I have personally reviewed following labs and imaging studies  CBC: Recent Labs  Lab 12/07/17 1903  WBC 10.3  HGB 12.1  HCT 40.3  MCV 91.4  PLT 231   Basic Metabolic Panel: Recent Labs  Lab 12/07/17 1903  NA 144  K 3.3*  CL 109  CO2 23  GLUCOSE 135*  BUN 23  CREATININE 1.15*  CALCIUM 9.1   GFR: CrCl cannot be calculated (Unknown ideal weight.). Liver Function Tests: No results for input(s): AST, ALT, ALKPHOS, BILITOT, PROT, ALBUMIN in the last 168 hours. No results for input(s): LIPASE, AMYLASE in the last 168 hours. No results for input(s): AMMONIA in the last 168 hours. Coagulation Profile: No results for  input(s): INR, PROTIME in the last 168 hours. Cardiac Enzymes: Recent Labs  Lab 12/07/17 1903  TROPONINI 0.06*   BNP (last 3 results) No results for input(s): PROBNP in the last 8760 hours. HbA1C: No results for input(s): HGBA1C in the last 72 hours. CBG: Recent Labs  Lab 12/07/17 2024  GLUCAP 114*   Lipid Profile: No results for input(s): CHOL, HDL, LDLCALC, TRIG, CHOLHDL, LDLDIRECT in the last 72 hours. Thyroid Function Tests: No results for input(s): TSH, T4TOTAL, FREET4, T3FREE, THYROIDAB in the last 72 hours. Anemia Panel: No results for input(s): VITAMINB12, FOLATE, FERRITIN, TIBC, IRON, RETICCTPCT in the last 72 hours. Urine analysis:    Component Value  Date/Time   COLORURINE YELLOW 12/07/2017 2243   APPEARANCEUR TURBID (A) 12/07/2017 2243   APPEARANCEUR Cloudy 03/09/2015   APPEARANCEUR Cloudy (A) 06/03/2014 1200   LABSPEC 1.014 12/07/2017 2243   LABSPEC 1.01 03/09/2015   PHURINE 7.0 12/07/2017 2243   GLUCOSEU NEGATIVE 12/07/2017 2243   HGBUR MODERATE (A) 12/07/2017 2243   BILIRUBINUR NEGATIVE 12/07/2017 2243   BILIRUBINUR Negative 06/03/2014 1200   KETONESUR 5 (A) 12/07/2017 2243   PROTEINUR 100 (A) 12/07/2017 2243   UROBILINOGEN Normal 03/09/2015   NITRITE NEGATIVE 12/07/2017 2243   LEUKOCYTESUR MODERATE (A) 12/07/2017 2243   LEUKOCYTESUR 4+ (A) 03/09/2015   Sepsis Labs: @LABRCNTIP (procalcitonin:4,lacticidven:4) )No results found for this or any previous visit (from the past 240 hour(s)).   Radiological Exams on Admission: Dg Chest 2 View  Result Date: 12/07/2017 CLINICAL DATA:  Syncope EXAM: CHEST - 2 VIEW COMPARISON:  None. FINDINGS: Heart and mediastinal contours are within normal limits. No focal opacities or effusions. No acute bony abnormality. IMPRESSION: No active cardiopulmonary disease. Electronically Signed   By: Charlett Nose M.D.   On: 12/07/2017 20:15   Ct Head Wo Contrast  Result Date: 12/07/2017 CLINICAL DATA:  Syncope EXAM: CT HEAD  WITHOUT CONTRAST TECHNIQUE: Contiguous axial images were obtained from the base of the skull through the vertex without intravenous contrast. COMPARISON:  None. FINDINGS: Brain: There is atrophy and chronic small vessel disease changes. No acute intracranial abnormality. Specifically, no hemorrhage, hydrocephalus, mass lesion, acute infarction, or significant intracranial injury. Vascular: No hyperdense vessel or unexpected calcification. Skull: No acute calvarial abnormality. Sinuses/Orbits: Visualized paranasal sinuses and mastoids clear. Orbital soft tissues unremarkable. Other: None IMPRESSION: No acute intracranial abnormality. Atrophy, chronic microvascular disease. Electronically Signed   By: Charlett Nose M.D.   On: 12/07/2017 21:04    EKG: Independently reviewed.  Normal sinus rhythm.  Assessment/Plan Principal Problem:   Syncope Active Problems:   Hyperthyroidism   Osteoarthrosis involving lower leg   Essential hypertension   PAD (peripheral artery disease) (HCC)   Stage III pressure ulcer of sacral region (HCC)   Acute lower UTI    1. Syncope -as per the report patient was hypotensive which probably could have contributed to the syncopal episode.  However patient's troponin is mildly elevated we will cycle cardiac markers.  Will get cardiology consult if continues to remain high.  Patient has such denies any chest pain.  Closely monitor in telemetry. 2. UTI -placed on ceftriaxone.  Follow urine cultures. 3. History of hypertension with hypotensive episode -for now we are holding off patient's antihypertensive and keeping patient on PRN IV hydralazine.  Closely monitor blood pressure trends. 4. Acute renal failure likely from hypotension.  Gently hydrate recheck metabolic panel.  Patient is also on ACE inhibitors.  Restart if creatinine improves. 5. History of dementia with history of depression on Depakote and Lexapro.  Check Depakote level.  Continue medications. 6. Hyperthyroidism  on Tapazole. 7. Legally blind.   DVT prophylaxis: Lovenox. Code Status: Full code. Family Communication: Patient's son. Disposition Plan: Home. Consults called: Cardiology. Admission status: Observation.   Eduard Clos MD [facility] Pager [phone].  If 7PM-7AM, please contact night-coverage [email or web address] Password [facility]  12/08/2017, 12:32 AM

## 2017-12-08 NOTE — Consult Note (Addendum)
Cardiology Consultation:   Patient ID: Melanie Parker MRN: [id number]; DOB: 09-10-1923  Admit date: 12/07/2017 Date of Consult: 12/08/2017  Primary Care Provider: Jarome Matin, MD Primary Cardiologist: Donato Schultz, MD NEW Primary Electrophysiologist:  None    Patient Profile:   Melanie Parker is a 84 y.o. female with a hx of HTN, HLD, mild dementia  And legally blind who is being seen today for the evaluation of 6 minutes of syncope at the request of Dr. Waymon Amato.  History of Present Illness:   Melanie Parker with hx as above, levies at [facility] and was found unresponsive by her son 12/07/17.  The nurse told him she had been doing well until the moment he found her.  BP was found to be 60 systolic, unsure what her glucose was.   She did regain consciousness without intervention.  Per one note she was unresponsive for 6 minutes.   She denied chest pain and SOB.   She has a hx of HTN with hypotensive episodes.    In ER:    EKG SR with T wave inversions in III, AVF and V4-6  New compared to 2016  I personally reviewed.    Tele :  SR to SB 50 mostly SR in 60s  Troponin 0.06;  0.08; 0.08; 0.10  Na 144, k+ 3.3, glucose 135 Cr 1.15 now 0.96  TSH 0.552 Hgb 12.5 plts 231 and WBC 10.3    CXR with no cardiopulmonary disease  BP elevated 159/66 ti 108/90 on arrival. Today 111/44 P 56   Currently complains of back pain, she is hard of hearing.  No chest pain.  She does not remember about yesterday.    Past Medical History:  Diagnosis Date  . Arthritis   . Carpal tunnel syndrome   . Cataract, right   . Glaucoma   . High blood pressure   . High cholesterol   . History of recurrent UTI (urinary tract infection)   . Mild dementia   . Overactive thyroid gland   . Spinal stenosis     Past Surgical History:  Procedure Laterality Date  . CARPAL TUNNEL RELEASE    . CATARACT EXTRACTION  2000     Home Medications:  Prior to Admission medications   Medication Sig Start  Date End Date Taking? Authorizing Provider  amLODipine (NORVASC) 10 MG tablet Take 10 mg by mouth daily. For blood pressure   Yes [provider]  atorvastatin (LIPITOR) 10 MG tablet Take 10 mg by mouth daily. For high  cholesterol   Yes [provider]  brimonidine (ALPHAGAN) 0.15 % ophthalmic solution Place 1 drop into the right eye 3 (three) times daily.    Yes [provider]  Coenzyme Q10 (COQ-10) 10 MG CAPS Take 10 mg by mouth every other day.   Yes [provider]  divalproex (DEPAKOTE SPRINKLE) 125 MG capsule Take 125-250 mg by mouth See admin instructions. Take 1 capsule every morning and at 1400 then take 2 capsules at bedtime   Yes [provider]  docusate sodium (COLACE) 100 MG capsule Take 100 mg by mouth 2 (two) times daily.   Yes [provider]  dorzolamide (TRUSOPT) 2 % ophthalmic solution Place 1 drop into both eyes 2 (two) times daily.   Yes [provider]  escitalopram (LEXAPRO) 5 MG tablet Take 5 mg by mouth daily.   Yes [provider]  feeding supplement (BOOST / RESOURCE BREEZE) LIQD Take 1 Container by mouth 2 (two)  times daily between meals.   Yes [provider]  iron polysaccharides (NIFEREX) 150 MG capsule Take 150 mg by mouth 2 (two) times daily.   Yes [provider]  latanoprost (XALATAN) 0.005 % ophthalmic solution Place 1 drop into both eyes at bedtime.   Yes [provider]  lisinopril (PRINIVIL,ZESTRIL) 20 MG tablet Take 20 mg by mouth daily.   Yes [provider]  meloxicam (MOBIC) 15 MG tablet Take 1 tablet (15 mg total) by mouth daily. 07/15/14  Yes Kirt Boys, DO  methimazole (TAPAZOLE) 5 MG tablet Take 1 tablet (5 mg total) by mouth daily. For hyperthyroidism 03/14/14  Yes Reed, Tiffany L, DO  Omega 3-6-9 Fatty Acids (OMEGA-3 FUSION) LIQD Take 15 mLs by mouth daily.   Yes [provider]  OVER THE COUNTER MEDICATION Take 2.5 mLs by mouth  daily. [facility]   Yes [provider]  Probiotic Product (NEXABIOTIC PO) Take 1 capsule by mouth daily.   Yes [provider]  sennosides-docusate sodium (SENOKOT-S) 8.6-50 MG tablet Take 2 tablets by mouth at bedtime.   Yes [provider]  timolol (TIMOPTIC) 0.5 % ophthalmic solution Place 1 drop into both eyes 2 (two) times daily.   Yes [provider]    Inpatient Medications: Scheduled Meds: . brimonidine  1 drop Right Eye TID  . divalproex  125 mg Oral BID BM  . divalproex  250 mg Oral QHS  . dorzolamide  1 drop Both Eyes BID  . enoxaparin (LOVENOX) injection  40 mg Subcutaneous Q24H  . escitalopram  5 mg Oral Daily  . latanoprost  1 drop Both Eyes QHS  . methimazole  5 mg Oral Daily  . timolol  1 drop Both Eyes BID   Continuous Infusions: . cefTRIAXone (ROCEPHIN)  IV    . dextrose     PRN Meds: acetaminophen **OR** acetaminophen, hydrALAZINE, ondansetron **OR** ondansetron (ZOFRAN) IV  Allergies:    Allergies  Allergen Reactions  . Ivp Dye [Iodinated Diagnostic Agents] Anaphylaxis  . Iodine Other (See Comments)    On MAR  . Naprosyn [Naproxen]     Social History:   Social History   Socioeconomic History  . Marital status: Widowed    Spouse name: Not on file  . Number of children: Not on file  . Years of education: Not on file  . Highest education level: Not on file  Occupational History  . Not on file  Social Needs  . Financial resource strain: Not on file  . Food insecurity:    Worry: Not on file    Inability: Not on file  . Transportation needs:    Medical: Not on file    Non-medical: Not on file  Tobacco Use  . Smoking status: Never Smoker  . Smokeless tobacco: Current User    Types: Snuff  Substance and Sexual Activity  . Alcohol use: No    Alcohol/week: 0.0 standard drinks  . Drug use: No  . Sexual activity: Not Currently  Lifestyle  . Physical activity:    Days per week: Not on file    Minutes per  session: Not on file  . Stress: Not on file  Relationships  . Social connections:    Talks on phone: Not on file    Gets together: Not on file    Attends religious service: Not on file    Active member of club or organization: Not on file    Attends meetings of clubs or organizations:  Not on file    Relationship status: Not on file  . Intimate partner violence:    Fear of current or ex partner: Not on file    Emotionally abused: Not on file    Physically abused: Not on file    Forced sexual activity: Not on file  Other Topics Concern  . 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 Arthritis Mother   . Heart disease Mother   . Heart disease Father   . Lung disease Brother   . Cancer Brother        Prostate      ROS:  Please see the history of present illness. Pt poor historian best we could do. General:no colds or fevers, no weight changes Skin:no rashes or ulcers HEENT:no blurred vision, no congestion CV:see HPI PUL:see HPI GI:no diarrhea constipation or melena, no indigestion GU:no hematuria, no dysuria MS:no joint pain, no claudication Neuro:no syncope, no lightheadedness Endo:no diabetes, + thyroid disease  All other ROS reviewed and negative.     Physical Exam/Data:   Vitals:   12/08/17 0134 12/08/17 0444 12/08/17 1054 12/08/17 1218  BP: (!) 164/62 (!) 123/58 (!) 121/42 (!) 111/44  Pulse: 73 83 63 (!) 56  Resp:  16  20  Temp: 98.5 F (36.9 C) 98.7 F (37.1 C)  98.8 F (37.1 C)  TempSrc: Oral Oral  Oral  SpO2: 99% 98% 99% 100%  Weight:      Height:        Intake/Output Summary (Last 24 hours) at 12/08/2017 1448 Last data filed at 12/08/2017 1053 Gross per 24 hour  Intake 800 ml  Output 0 ml  Net 800 ml     Filed Weights   12/08/17 0124  Weight: 54.1 kg   Body mass index is 20.47 kg/m.  General:  Well nourished, well developed, in no acute distress HEENT: normal Lymph: no adenopathy Neck: mild JVD Endocrine:  No thryomegaly Vascular: No carotid bruits;pedal pulses 2+ bilaterally   Cardiac:  normal S1, S2; RRR; no murmur gallup rub or click Lungs:  clear to auscultation bilaterally, no wheezing, rhonchi or rales  Abd: soft, nontender, no hepatomegaly  Ext: no edema Musculoskeletal:  No deformities, BUE and BLE strength normal and equal Skin: warm and dry  Neuro:  Is legally blind, Hard of hearing, MAE follows commands, no focal abnormalities noted Psych:  Normal affect   Relevant CV Studies: No echo   Laboratory Data:  Chemistry Recent Labs  Lab 12/07/17 1903 12/08/17 0750  NA 144 147*  K 3.3* 3.8  CL 109 115*  CO2 23 24  GLUCOSE 135* 91  BUN 23 20  CREATININE 1.15* 0.96  CALCIUM 9.1 8.8*  GFRNONAA 40* 50*  GFRAA 46* 58*  ANIONGAP 12 8    No results for input(s): PROT, ALBUMIN, AST, ALT, ALKPHOS, BILITOT in the last 168 hours. Hematology Recent Labs  Lab 12/07/17 1903 12/08/17 0750  WBC 10.3 8.0  RBC 4.41 3.91  HGB 12.1 10.7*  HCT 40.3 34.6*  MCV 91.4 88.5  MCH 27.4 27.4  MCHC 30.0 30.9  RDW 17.1* 17.0*  PLT 231 193   Cardiac Enzymes Recent Labs  Lab  12/07/17 1903 12/08/17 0233 12/08/17 0750  TROPONINI 0.06* 0.08* 0.10*    Recent Labs  Lab 12/07/17 2313  TROPIPOC 0.08    BNPNo results for input(s): BNP, PROBNP in the last 168 hours.  DDimer No results for input(s): DDIMER in the last 168 hours.  Radiology/Studies:  Dg Chest 2 View  Result Date: 12/07/2017 CLINICAL DATA:  Syncope EXAM: CHEST - 2 VIEW COMPARISON:  None. FINDINGS: Heart and mediastinal contours are within normal limits. No focal opacities or effusions. No acute bony abnormality. IMPRESSION: No active cardiopulmonary disease. Electronically Signed   By: Charlett NoseKevin  Dover M.D.   On:  12/07/2017 20:15   Ct Head Wo Contrast  Result Date: 12/07/2017 CLINICAL DATA:  Syncope EXAM: CT HEAD WITHOUT CONTRAST TECHNIQUE: Contiguous axial images were obtained from the base of the skull through the vertex without intravenous contrast. COMPARISON:  None. FINDINGS: Brain: There is atrophy and chronic small vessel disease changes. No acute intracranial abnormality. Specifically, no hemorrhage, hydrocephalus, mass lesion, acute infarction, or significant intracranial injury. Vascular: No hyperdense vessel or unexpected calcification. Skull: No acute calvarial abnormality. Sinuses/Orbits: Visualized paranasal sinuses and mastoids clear. Orbital soft tissues unremarkable. Other: None IMPRESSION: No acute intracranial abnormality. Atrophy, chronic microvascular disease. Electronically Signed   By: Charlett NoseKevin  Dover M.D.   On: 12/07/2017 21:04    Assessment and Plan:   1. Syncope due to hypotension will check echo to eval wall motion abnormalities and if present would adjust meds.    2. Elevated troponins may reflect the hypotension with demand ischemia. No chest pain. May be helpful to check echo 3. HTN on medication as outpt all stopped  4. Mild dementia  5. HLD  6. AKI on arrival thought to be dehydration. Improved  7. UTI on ABX followed by IM  8. Hx hyperthyroid on tapazole.  9. Legally blind 10. Mild anemia IM to follow niferex at home       For questions or updates, please contact [facility] Please consult [email or web address] for contact info under     Signed, Nada BoozerLaura Ingold, NP  12/08/2017 2:48 PM  Personally seen and examined. Agree with above. 84 year old who was found by her family member in bed unresponsive for several minutes duration, initial blood pressure was in the 60s.  Brought to the emergency room found to have dehydration and UTI. A troponin was checked.  Mildly abnormal, flat. EKG however was abnormal showing T wave inversion inferiorly and laterally personally reviewed  and interpreted.  She is currently in bed, sleeping, regular rate and rhythm, no murmurs rubs or gallops, perhaps minimal JVD, lungs are clear, no edema, abdomen soft.  Legally blind.  Echocardiogram- ordered by our team, pending  Assessment and plan:  Syncope - Secondary to intravascular volume depletion, urinary tract infection.  Does not sound arrhythmogenic.  Holding antihypertensives, agree.  Hydrating. - Given her abnormal EKG with inferior lateral T wave inversions, we will check an echocardiogram which if abnormal may help guide our medical therapy.  Otherwise, no further invasive or noninvasive evaluation.  Telemetry thus far has been unremarkable.  I would not advocate for any long-term monitoring.  Elevated troponin -Low level flat demand ischemia in the setting of hypotension.  No further cardiac evaluation other than echocardiogram.  Minimally elevated troponin in this fashion does portend a worsened prognosis overall.  Mild dementia/hyperlipidemia/AKI/UTI - Stable.  Antibiotics.  Hydration.  Hyperthyroidism -Medically managed with Tapazole.  Donato SchultzMark Sharmin Foulk, MD

## 2017-12-09 ENCOUNTER — Inpatient Hospital Stay (HOSPITAL_COMMUNITY): Payer: Medicare Other

## 2017-12-09 DIAGNOSIS — E870 Hyperosmolality and hypernatremia: Secondary | ICD-10-CM

## 2017-12-09 DIAGNOSIS — I361 Nonrheumatic tricuspid (valve) insufficiency: Secondary | ICD-10-CM

## 2017-12-09 LAB — ECHOCARDIOGRAM COMPLETE
Height: 64 in
WEIGHTICAEL: 1931.2 [oz_av]

## 2017-12-09 LAB — BASIC METABOLIC PANEL
Anion gap: 8 (ref 5–15)
BUN: 23 mg/dL (ref 8–23)
CO2: 25 mmol/L (ref 22–32)
Calcium: 8.8 mg/dL — ABNORMAL LOW (ref 8.9–10.3)
Chloride: 113 mmol/L — ABNORMAL HIGH (ref 98–111)
Creatinine, Ser: 1.2 mg/dL — ABNORMAL HIGH (ref 0.44–1.00)
GFR calc Af Amer: 44 mL/min — ABNORMAL LOW (ref >60)
GFR, EST NON AFRICAN AMERICAN: 38 mL/min — AB (ref >60)
GLUCOSE: 109 mg/dL — AB (ref 70–99)
Potassium: 4.8 mmol/L (ref 3.5–5.1)
Sodium: 146 mmol/L — ABNORMAL HIGH (ref 135–145)

## 2017-12-09 LAB — CBC
HCT: 33.3 % — ABNORMAL LOW (ref 36.0–46.0)
Hemoglobin: 9.9 g/dL — ABNORMAL LOW (ref 12.0–15.0)
MCH: 27.1 pg (ref 26.0–34.0)
MCHC: 29.7 g/dL — AB (ref 30.0–36.0)
MCV: 91.2 fL (ref 78.0–100.0)
PLATELETS: 184 10*3/uL (ref 150–400)
RBC: 3.65 MIL/uL — AB (ref 3.87–5.11)
RDW: 17.2 % — AB (ref 11.5–15.5)
WBC: 8.6 10*3/uL (ref 4.0–10.5)

## 2017-12-09 MED ORDER — DEXTROSE 5 % IV SOLN
INTRAVENOUS | Status: AC
  Administered 2017-12-09: 16:00:00 via INTRAVENOUS

## 2017-12-09 MED ORDER — FREE WATER
100.0000 mL | Status: DC
  Administered 2017-12-09 – 2017-12-10 (×7): 100 mL via ORAL

## 2017-12-09 MED ORDER — DEXTROSE 5 % IV SOLN: INTRAVENOUS | Status: DC

## 2017-12-09 NOTE — Plan of Care (Signed)
?  Problem: Elimination: ?Goal: Will not experience complications related to urinary retention ?Outcome: Progressing ?  ?

## 2017-12-09 NOTE — Progress Notes (Signed)
Patient ready for transfer, report called to 6N RN, son notified.

## 2017-12-09 NOTE — Progress Notes (Signed)
PROGRESS NOTE   Melanie Parker  WJX:[id number]    DOB: 06/16/1923    DOA: 12/07/2017  PCP: Jarome MatinPaterson, Daniel, MD   I have briefly reviewed patients [facility].  Brief Narrative:  84 year old female, resident of [facility], largely bedbound because of arthritis and mobile with a wheelchair, PMH of HTN, HLD, dementia, UTI, hypothyroid, legally blind, [facility] ED 12/07/2017 following a syncopal episode while sitting on her wheelchair.  As per report, SBP at that time was in the 60s and CBGs were not hypoglycemic.  She regained consciousness without intervention.  She was admitted for evaluation and management of syncope, suspected acute lower UTI, hypotension, acute kidney injury.   Assessment & Plan:   Principal Problem:   Syncope Active Problems:   Hyperthyroidism   Osteoarthrosis involving lower leg   Essential hypertension   PAD (peripheral artery disease) (HCC)   Stage III pressure ulcer of sacral region (HCC)   Acute lower UTI   Syncope: Suspect due to hypotension.  SBP in the 60s during the event.  EKG: Sinus rhythm, normal axis, T inversion in inferior lateral leads but no acute changes.  No reported chest pain.  Troponin minimally elevated: 0.06 > 0.08 > 0.10, could be demand ischemia from hypotension, UTI.  Telemetry without arrhythmias.  CT head without acute findings.  Cardiology follow-up appreciated, telemetry unremarkable, echo with normal EF and recommend no further monitoring or evaluation and signed off.  Acute lower UTI: Continue IV ceftriaxone pending urine culture results.  Urine culture >100 K of unidentified organism, speciation and sensitivities pending.  Hypotension: Suspect combination of dehydration and antihypertensives.  Antihypertensives held.  Hydrated with IV fluids.  Hypotension improved.  Essential hypertension: Antihypertensives temporarily held due to presentation with hypotension.  Consider resuming  antihypertensives in a.m.  PRN IV hydralazine for now.  Acute kidney injury: Presented with creatinine of 1.15.  Creatinine had normalized but back up to 1.2.  Continue IV D5 infusion and follow BMP in a.m.  Dehydration with hypernatremia: Sodium down from 147-146 with D5 infusion.  Encourage free water intake.  Continue additional 24 hours of D5 infusion.  Follow BMP in a.m.  Elevated troponin: Cardiology evaluated and suspect likely mild demand ischemia in the setting of hypotension.  Echo with normal EF.  No further work-up recommended.  Dementia with depression: No active behavioral issues at this time.  Continue Depakote and Lexapro.  Stable.  Hyperthyroid: Continue Tapazole.  Clinically euthyroid.  Stable.  Legally blind  Normocytic anemia: Could be dilutional.  Hemoglobin has further dropped from 10.7-9.9 but no overt bleeding.  Follow CBC in a.m.  Bradycardia: As per cardiology, asymptomatic, heart rates occasionally 49, no indication for pacemaker and they have stopped atenolol.   DVT prophylaxis: Lovenox Code Status: Full Family Communication: None at bedside Disposition: DC back to SNF pending clinical improvement, hopefully 9/25 pending improvement in hypernatremia, creatinine and final urine culture sensitivity results.   Consultants:  Cardiology  Procedures:  None  Antimicrobials:  IV ceftriaxone   Subjective: Poor historian.  "I want to sleep".  No pain reported.  Unable to get any further history from her.  As per RN, no acute issues noted.  ROS: As above.  Objective:  Vitals:   12/08/17 1218 12/08/17 2110 12/09/17 0357 12/09/17 1210  BP: (!) 111/44 (!) 120/47 116/87 (!) 92/43  Pulse: (!) 56 60 62 (!) 50  Resp: 20 16 18 18   Temp: 98.8 F (37.1 C) 98.6 F (  37 C) 98.3 F (36.8 C) (!) 97.5 F (36.4 C)  TempSrc: Oral Oral Oral Oral  SpO2: 100% 97% 98% 97%  Weight:   54.7 kg   Height:        Examination:  General exam: Pleasant elderly female,  small built and frail, lying comfortably supine in bed.  Oral mucosa with borderline hydration Respiratory system: Poor inspiratory effort but seems clear to auscultation. Respiratory effort normal.  Stable Cardiovascular system: S1 & S2 heard, RRR. No JVD, murmurs, rubs, gallops or clicks. No pedal edema.  Telemetry personally reviewed: SB in the 50s-SR. Gastrointestinal system: Abdomen is nondistended, soft and nontender. No organomegaly or masses felt. Normal bowel sounds heard.  Stable. Central nervous system: Alert and oriented only to self.  Does not follow instructions. No focal neurological deficits.  No change/stable. Extremities: Moves both upper extremities seemingly normally.  Lower extremity at least grade 2 x 5 power. Skin: No rashes, lesions or ulcers Psychiatry: Judgement and insight impaired. Mood & affect appropriate.     Data Reviewed: I have personally reviewed following labs and imaging studies  CBC: Recent Labs  Lab 12/07/17 1903 12/08/17 0750 12/09/17 0557  WBC 10.3 8.0 8.6  HGB 12.1 10.7* 9.9*  HCT 40.3 34.6* 33.3*  MCV 91.4 88.5 91.2  PLT 231 193 184   Basic Metabolic Panel: Recent Labs  Lab 12/07/17 1903 12/08/17 0750 12/09/17 0557  NA 144 147* 146*  K 3.3* 3.8 4.8  CL 109 115* 113*  CO2 23 24 25   GLUCOSE 135* 91 109*  BUN 23 20 23   CREATININE 1.15* 0.96 1.20*  CALCIUM 9.1 8.8* 8.8*   Cardiac Enzymes: Recent Labs  Lab 12/07/17 1903 12/08/17 0233 12/08/17 0750 12/08/17 1416  TROPONINI 0.06* 0.08* 0.10* 0.08*   CBG: Recent Labs  Lab 12/07/17 2024  GLUCAP 114*    Recent Results (from the past 240 hour(s))  Urine culture     Status: Abnormal (Preliminary result)   Collection Time: 12/07/17 10:43 PM  Result Value Ref Range Status   Specimen Description URINE, CATHETERIZED  Final   Special Requests NONE  Final   Culture (A)  Final    >=100,000 COLONIES/mL UNIDENTIFIED ORGANISM Performed at [facility], [address]  [place], [place] [address]    Report Status PENDING  Incomplete  MRSA PCR Screening     Status: None   Collection Time: 12/08/17  1:48 AM  Result Value Ref Range Status   MRSA by PCR NEGATIVE NEGATIVE Final    Comment:        The GeneXpert MRSA Assay (FDA approved for NASAL specimens only), is one component of a comprehensive MRSA colonization surveillance program. It is not intended to diagnose MRSA infection nor to guide or monitor treatment for MRSA infections. Performed at [facility], [address] [place], [place] [address]          Radiology Studies: Dg Chest 2 View  Result Date: 12/07/2017 CLINICAL DATA:  Syncope EXAM: CHEST - 2 VIEW COMPARISON:  None. FINDINGS: Heart and mediastinal contours are within normal limits. No focal opacities or effusions. No acute bony abnormality. IMPRESSION: No active cardiopulmonary disease. Electronically Signed   By: Charlett Nose M.D.   On: 12/07/2017 20:15   Ct Head Wo Contrast  Result Date: 12/07/2017 CLINICAL DATA:  Syncope EXAM: CT HEAD WITHOUT CONTRAST TECHNIQUE: Contiguous axial images were obtained from the base of the skull through the vertex without intravenous contrast. COMPARISON:  None.  FINDINGS: Brain: There is atrophy and chronic small vessel disease changes. No acute intracranial abnormality. Specifically, no hemorrhage, hydrocephalus, mass lesion, acute infarction, or significant intracranial injury. Vascular: No hyperdense vessel or unexpected calcification. Skull: No acute calvarial abnormality. Sinuses/Orbits: Visualized paranasal sinuses and mastoids clear. Orbital soft tissues unremarkable. Other: None IMPRESSION: No acute intracranial abnormality. Atrophy, chronic microvascular disease. Electronically Signed   By: Charlett Nose M.D.   On: 12/07/2017 21:04        Scheduled Meds: . brimonidine  1 drop Right Eye TID  . divalproex  125 mg Oral BID BM  . divalproex  250 mg Oral QHS  . dorzolamide  1 drop Both Eyes  BID  . enoxaparin (LOVENOX) injection  40 mg Subcutaneous Q24H  . escitalopram  5 mg Oral Daily  . latanoprost  1 drop Both Eyes QHS  . methimazole  5 mg Oral Daily   Continuous Infusions: . cefTRIAXone (ROCEPHIN)  IV Stopped (12/08/17 1832)  . dextrose       LOS: 1 day     Marcellus Scott, MD, FACP, [facility]. [facility] Pager [phone]  If 7PM-7AM, please contact night-coverage [email or web address] Password [email or web address] 12/09/2017, 1:44 PM

## 2017-12-09 NOTE — Evaluation (Signed)
Physical Therapy Evaluation Patient Details Name: Melanie Parker MRN: [id number] DOB: 06/26/1923 Today's Date: 12/09/2017   History of Present Illness   Melanie Parker is a 84 y.o. female with history of hypertension, legally blind, dementia, hypothyroidism was found to be unresponsive with loss of consciousness by patient's son last evening when patient's son went to check on the patient at the nursing home.   Clinical Impression  Per chart pt appears at baseline. Pt with dementia, extremely hard of hearing and legally blind. Pt resistant to all mobility with poor comprehension of roll of PT and trying to assist her in performing hygiene s/p BM. Pt asked us to carry her to a tub so she can bath. Pt becomes very agitated with mobility and can become combative. Pt remains appropriate to return to her previous SNF once medically stable. PT bed/wc bound at baseline. PT to monitor patient while in hospital.    Follow Up Recommendations SNF    Equipment Recommendations  None recommended by PT    Recommendations for Other Services       Precautions / Restrictions Precautions Precautions: Fall Precaution Comments: legally blind, HOH, dementia Restrictions Weight Bearing Restrictions: No      Mobility  Bed Mobility Overal bed mobility: Needs Assistance Bed Mobility: Supine to Sit;Sit to Supine     Supine to sit: Total assist;+2 for physical assistance Sit to supine: Total assist;+2 for physical assistance   General bed mobility comments: pt with no initiation or participation in task, pt dependent to transfer to EOB and return to supine, +2 [facility]  Transfers                 General transfer comment: unable, suspect they use hoyer lift at SNF to transfer her to a chair  Ambulation/Gait             General Gait Details: non-ambulatory  Stairs            Wheelchair Mobility    Modified Rankin (Stroke Patients Only)       Balance Overall  balance assessment: Needs assistance Sitting-balance support: Feet supported;Bilateral upper extremity supported Sitting balance-Leahy Scale: Zero Sitting balance - Comments: dependent to maintain EOB sitting, significant retropulsion and resistance                                     Pertinent Vitals/Pain Pain Assessment: Faces Faces Pain Scale: Hurts even more Pain Location: "my butt is raw" Pain Descriptors / Indicators: Sore Pain Intervention(s): Monitored during session    Home Living Family/patient expects to be discharged to:: Skilled nursing facility                 Additional Comments: unsure of what facility    Prior Function Level of Independence: Needs assistance   Gait / Transfers Assistance Needed: pt bed/w/c bound  ADL's / Homemaking Assistance Needed: dependent on SNF staff        Hand Dominance        Extremity/Trunk Assessment   Upper Extremity Assessment Upper Extremity Assessment: Generalized weakness(pt resistive to all mvmt, limiting MMT)    Lower Extremity Assessment Lower Extremity Assessment: Difficult to assess due to impaired cognition(pt resisted mvmt holding legs in bilat ext and adduction)    Cervical / Trunk Assessment Cervical / Trunk Assessment: Kyphotic  Communication   Communication: HOH  Cognition Arousal/Alertness: Awake/alert Behavior During  Therapy: Agitated(easily agitated with movement) Overall Cognitive Status: History of cognitive impairments - at baseline                                 General Comments: pt with noted dementia, pt confused states she lives in [place], very agitated and agressive with PT and tech when attempting to roll her to perform hygiene s/p BM      General Comments General comments (skin integrity, edema, etc.): pt dependent for hygiene s/p BM in the bed    Exercises     Assessment/Plan    PT Assessment Patient needs continued PT services  PT Problem  List Decreased strength;Decreased range of motion;Decreased mobility;Decreased activity tolerance;Decreased balance;Decreased coordination;Decreased cognition;Decreased knowledge of use of DME;Decreased knowledge of precautions;Decreased safety awareness       PT Treatment Interventions DME instruction;Functional mobility training;Therapeutic activities;Therapeutic exercise;Balance training;Neuromuscular re-education;Cognitive remediation    PT Goals (Current goals can be found in the Care Plan section)  Acute Rehab PT Goals Patient Stated Goal: didn't state PT Goal Formulation: Patient unable to participate in goal setting Time For Goal Achievement: 12/23/17 Potential to Achieve Goals: Good    Frequency Min 2X/week   Barriers to discharge Decreased caregiver support      Co-evaluation               AM-PAC PT "6 Clicks" Daily Activity  Outcome Measure Difficulty turning over in bed (including adjusting bedclothes, sheets and blankets)?: Unable Difficulty moving from lying on back to sitting on the side of the bed? : Unable Difficulty sitting down on and standing up from a chair with arms (e.g., wheelchair, bedside commode, etc,.)?: Unable Help needed moving to and from a bed to chair (including a wheelchair)?: Total Help needed walking in hospital room?: Total Help needed climbing 3-5 steps with a railing? : Total 6 Click Score: 6    End of Session Equipment Utilized During Treatment: Gait belt Activity Tolerance: Treatment limited secondary to agitation Patient left: in bed;with call bell/phone within reach;with nursing/sitter in room;with bed alarm set Nurse Communication: Mobility status PT Visit Diagnosis: Difficulty in walking, not elsewhere classified (R26.2)    Time: 1610-9604 PT Time Calculation (min) (ACUTE ONLY): 25 min   Charges:   PT Evaluation $PT Eval Moderate Complexity: 1 Mod PT Treatments $Therapeutic Activity: 8-22 mins        Melanie Parker,  PT, DPT Acute Rehabilitation Services Pager #: [phone] Office #: [phone]   Melanie Parker 12/09/2017, 9:52 AM

## 2017-12-09 NOTE — NC FL2 (Signed)
[place] MEDICAID FL2 LEVEL OF CARE SCREENING TOOL     IDENTIFICATION  Patient Name: Melanie Parker Birthdate: 10-05-1923 Sex: female Admission Date (Current Location): 12/07/2017  [facility] and [place] Number:  Producer, television/film/video and Address:  [facility] [place]. [facility], [address], [place], [place] [address]      Provider Number: [id number]  Attending Physician Name and Address:  Elease Etienne, MD  Relative Name and Phone Number:  Bettie Capistran; son; [phone]    Current Level of Care: Hospital Recommended Level of Care: Skilled Nursing Facility Prior Approval Number:    Date Approved/Denied:   PASRR Number: [id number]  Discharge Plan: SNF    Current Diagnoses: Patient Active Problem List   Diagnosis Date Noted  . Acute lower UTI 12/08/2017  . Syncope 12/07/2017  . Arthritis 07/15/2015  . Vascular dementia without behavioral disturbance 02/02/2015  . Stage III pressure ulcer of sacral region (HCC) 11/24/2014  . Tobacco use 11/24/2014  . Osteoarthritis of multiple joints 07/15/2014  . Urinary incontinence in female 02/09/2014  . PAD (peripheral artery disease) (HCC) 02/09/2014  . Insomnia 01/11/2014  . Cold intolerance 01/11/2014  . Hyperthyroidism 01/11/2014  . Osteoarthrosis involving lower leg 01/11/2014  . CN (constipation) 01/11/2014  . Glaucoma 01/11/2014  . Essential hypertension 01/11/2014  . Hyperlipidemia 01/11/2014    Orientation RESPIRATION BLADDER Height & Weight     Self  Normal Incontinent, External catheter Weight: 120 lb 11.2 oz (54.7 kg) Height:  5\' 4"  (162.6 cm)  BEHAVIORAL SYMPTOMS/MOOD NEUROLOGICAL BOWEL NUTRITION STATUS      Continent Diet(see discharge summary)  AMBULATORY STATUS COMMUNICATION OF NEEDS Skin   Total Care Verbally Other (Comment)(excoriation on sacrum; MASD on breasts )                       Personal Care Assistance Level of Assistance  Feeding, Bathing, Dressing Bathing Assistance:  Maximum assistance Feeding assistance: Limited assistance Dressing Assistance: Maximum assistance     Functional Limitations Info  Sight, Hearing, Speech Sight Info: Impaired Hearing Info: Adequate Speech Info: Adequate    SPECIAL CARE FACTORS FREQUENCY                       Contractures Contractures Info: Not present    Additional Factors Info  Code Status, Allergies, Psychotropic Code Status Info: Full Code Allergies Info: IVP DYE IODINATED DIAGNOSTIC AGENTS, IODINE, NAPROSYN NAPROXEN  Psychotropic Info: escitalopram (LEXAPRO) tablet 5 mg daily PO         Current Medications (12/09/2017):  This is the current hospital active medication list Current Facility-Administered Medications  Medication Dose Route Frequency Provider Last Rate Last Dose  . acetaminophen (TYLENOL) tablet 650 mg  650 mg Oral Q6H PRN Eduard Clos, MD       Or  . acetaminophen (TYLENOL) suppository 650 mg  650 mg Rectal Q6H PRN Eduard Clos, MD      . brimonidine (ALPHAGAN) 0.15 % ophthalmic solution 1 drop  1 drop Right Eye TID Eduard Clos, MD   1 drop at 12/09/17 1008  . cefTRIAXone (ROCEPHIN) 1 g in sodium chloride 0.9 % 100 mL IVPB  1 g Intravenous Q24H Eduard Clos, MD   Stopped at 12/08/17 1832  . divalproex (DEPAKOTE SPRINKLE) capsule 125 mg  125 mg Oral BID BM Eduard Clos, MD   125 mg at 12/09/17 1007  . divalproex (DEPAKOTE SPRINKLE) capsule 250 mg  250  mg Oral QHS Eduard ClosKakrakandy, Arshad N, MD   250 mg at 12/08/17 2212  . dorzolamide (TRUSOPT) 2 % ophthalmic solution 1 drop  1 drop Both Eyes BID Eduard ClosKakrakandy, Arshad N, MD   1 drop at 12/08/17 2217  . enoxaparin (LOVENOX) injection 40 mg  40 mg Subcutaneous Q24H Eduard ClosKakrakandy, Arshad N, MD   40 mg at 12/08/17 1244  . escitalopram (LEXAPRO) tablet 5 mg  5 mg Oral Daily Eduard ClosKakrakandy, Arshad N, MD   5 mg at 12/09/17 1006  . hydrALAZINE (APRESOLINE) injection 5 mg  5 mg Intravenous Q4H PRN Eduard ClosKakrakandy, Arshad N, MD       . latanoprost (XALATAN) 0.005 % ophthalmic solution 1 drop  1 drop Both Eyes QHS Eduard ClosKakrakandy, Arshad N, MD   1 drop at 12/08/17 2222  . methimazole (TAPAZOLE) tablet 5 mg  5 mg Oral Daily Eduard ClosKakrakandy, Arshad N, MD   5 mg at 12/09/17 1006  . ondansetron (ZOFRAN) tablet 4 mg  4 mg Oral Q6H PRN Eduard ClosKakrakandy, Arshad N, MD       Or  . ondansetron [facility](ZOFRAN) injection 4 mg  4 mg Intravenous Q6H PRN Eduard ClosKakrakandy, Arshad N, MD      . timolol (TIMOPTIC) 0.5 % ophthalmic solution 1 drop  1 drop Both Eyes BID Eduard ClosKakrakandy, Arshad N, MD   1 drop at 12/08/17 2235     Discharge Medications: Please see discharge summary for a list of discharge medications.  Relevant Imaging Results:  Relevant Lab Results:   Additional Information SS# [id number]  [place], [place]

## 2017-12-09 NOTE — Progress Notes (Signed)
Progress Note  Patient Name: Melanie Parker Date of Encounter: 12/09/2017  Primary Cardiologist: Donato SchultzMark Yomayra Tate, MD   Subjective   No new complaints, no chest pain, no shortness of breath.  Pretty much sleeping.  Inpatient Medications    Scheduled Meds: . brimonidine  1 drop Right Eye TID  . divalproex  125 mg Oral BID BM  . divalproex  250 mg Oral QHS  . dorzolamide  1 drop Both Eyes BID  . enoxaparin (LOVENOX) injection  40 mg Subcutaneous Q24H  . escitalopram  5 mg Oral Daily  . latanoprost  1 drop Both Eyes QHS  . methimazole  5 mg Oral Daily  . timolol  1 drop Both Eyes BID   Continuous Infusions: . cefTRIAXone (ROCEPHIN)  IV Stopped (12/08/17 1832)  . dextrose     PRN Meds: acetaminophen **OR** acetaminophen, hydrALAZINE, ondansetron **OR** ondansetron (ZOFRAN) IV   Vital Signs    Vitals:   12/08/17 1054 12/08/17 1218 12/08/17 2110 12/09/17 0357  BP: (!) 121/42 (!) 111/44 (!) 120/47 116/87  Pulse: 63 (!) 56 60 62  Resp:  20 16 18   Temp:  98.8 F (37.1 C) 98.6 F (37 C) 98.3 F (36.8 C)  TempSrc:  Oral Oral Oral  SpO2: 99% 100% 97% 98%  Weight:    54.7 kg  Height:        Intake/Output Summary (Last 24 hours) at 12/09/2017 1152 Last data filed at 12/09/2017 1110 Gross per 24 hour  Intake 1680 ml  Output 350 ml  Net 1330 ml   Filed Weights   12/08/17 0124 12/09/17 0357  Weight: 54.1 kg 54.7 kg    Telemetry    Sinus rhythm/sinus bradycardia at x49 bpm transiently- Personally Reviewed  ECG    No new EKG- Personally Reviewed  Physical Exam   GEN: No acute distress.  Resting comfortably in bed, legally blind Neck: No JVD Cardiac:  Bradycardic regular, no murmurs, rubs, or gallops.  Respiratory: Clear to auscultation bilaterally. GI: Soft, nontender, non-distended  MS: No edema; No deformity. Neuro:  Nonfocal  Psych: Tired  Labs    Chemistry Recent Labs  Lab 12/07/17 1903 12/08/17 0750 12/09/17 0557  NA 144 147* 146*  K 3.3* 3.8 4.8    CL 109 115* 113*  CO2 23 24 25   GLUCOSE 135* 91 109*  BUN 23 20 23   CREATININE 1.15* 0.96 1.20*  CALCIUM 9.1 8.8* 8.8*  GFRNONAA 40* 50* 38*  GFRAA 46* 58* 44*  ANIONGAP 12 8 8      Hematology Recent Labs  Lab 12/07/17 1903 12/08/17 0750 12/09/17 0557  WBC 10.3 8.0 8.6  RBC 4.41 3.91 3.65*  HGB 12.1 10.7* 9.9*  HCT 40.3 34.6* 33.3*  MCV 91.4 88.5 91.2  MCH 27.4 27.4 27.1  MCHC 30.0 30.9 29.7*  RDW 17.1* 17.0* 17.2*  PLT 231 193 184    Cardiac Enzymes Recent Labs  Lab 12/07/17 1903 12/08/17 0233 12/08/17 0750 12/08/17 1416  TROPONINI 0.06* 0.08* 0.10* 0.08*    Recent Labs  Lab 12/07/17 2313  TROPIPOC 0.08     BNPNo results for input(s): BNP, PROBNP in the last 168 hours.   DDimer No results for input(s): DDIMER in the last 168 hours.   Radiology    Dg Chest 2 View  Result Date: 12/07/2017 CLINICAL DATA:  Syncope EXAM: CHEST - 2 VIEW COMPARISON:  None. FINDINGS: Heart and mediastinal contours are within normal limits. No focal opacities or effusions. No acute bony abnormality. IMPRESSION: No  active cardiopulmonary disease. Electronically Signed   By: Charlett Nose M.D.   On: 12/07/2017 20:15   Ct Head Wo Contrast  Result Date: 12/07/2017 CLINICAL DATA:  Syncope EXAM: CT HEAD WITHOUT CONTRAST TECHNIQUE: Contiguous axial images were obtained from the base of the skull through the vertex without intravenous contrast. COMPARISON:  None. FINDINGS: Brain: There is atrophy and chronic small vessel disease changes. No acute intracranial abnormality. Specifically, no hemorrhage, hydrocephalus, mass lesion, acute infarction, or significant intracranial injury. Vascular: No hyperdense vessel or unexpected calcification. Skull: No acute calvarial abnormality. Sinuses/Orbits: Visualized paranasal sinuses and mastoids clear. Orbital soft tissues unremarkable. Other: None IMPRESSION: No acute intracranial abnormality. Atrophy, chronic microvascular disease. Electronically Signed    By: Charlett Nose M.D.   On: 12/07/2017 21:04    Cardiac Studies   Echocardiogram 12/09/2017-normal ejection fraction 65 to 70% with moderate LVH.  Patient Profile     84 y.o. female here with unresponsive episode, syncope in the setting of intravascular volume depletion, hypotension.  Assessment & Plan    Syncope/unresponsiveness -Telemetry is unremarkable.  Reassurance.  No further monitoring needed. -Echocardiogram also reassuring.  Normal EF.  Bradycardia - Asymptomatic in bed, heart rate occasionally 49 bpm.  No indication for pacemaker.  I would however stop her timolol eyedrops.  I will go ahead and discontinue them.  Elevated troponin -Likely mild demand ischemia in the setting of hypotension.  This does however portend a worsened prognosis.  Mild dementia -Stable  AKI -Improved with hydration  Hyperthyroidism -Managed medically with Tapazole.  [facility] will sign off.   Medication Recommendations: Stopping timolol eyedrops Other recommendations (labs, testing, etc): None Follow up as an outpatient: No follow-up necessary with cardiology  For questions or updates, please contact [facility] Please consult [email or web address] for contact info under        Signed, Donato Schultz, MD  12/09/2017, 11:52 AM

## 2017-12-09 NOTE — Progress Notes (Signed)
  Echocardiogram 2D Echocardiogram has been performed.  Melanie Parker, Melanie Parker 12/09/2017, 10:55 AM

## 2017-12-09 NOTE — Progress Notes (Signed)
Patient encouraged to drink water. Patient just wants to sleep and says she is cold, but is drinking a few sips when RN is in room.

## 2017-12-09 NOTE — Progress Notes (Signed)
Received patient as transfer from 3E. Patient alert to self. Patient oriented to call bell and room. Bed alarm on. Will continue to monitor.

## 2017-12-09 NOTE — Consult Note (Signed)
   [facility]   12/09/2017  Illene SilverCassie Vasques 06/18/1923 [id number]   Patient screened for potential [facility] Care Management services with Medicare.   [facility] and PT is recommending ongoing skilled facility for post hospital.  No current [facility] Care Management for community follow up needs noted. For questions contact:   Charlesetta ShanksVictoria Nayellie Sanseverino, RN BSN CCM [facility] Liaison  [phone] business mobile phone Toll free office [phone]

## 2017-12-10 DIAGNOSIS — N179 Acute kidney failure, unspecified: Secondary | ICD-10-CM

## 2017-12-10 DIAGNOSIS — N390 Urinary tract infection, site not specified: Principal | ICD-10-CM

## 2017-12-10 LAB — CBC
HCT: 33.6 % — ABNORMAL LOW (ref 36.0–46.0)
HEMOGLOBIN: 10.6 g/dL — AB (ref 12.0–15.0)
MCH: 27 pg (ref 26.0–34.0)
MCHC: 31.5 g/dL (ref 30.0–36.0)
MCV: 85.5 fL (ref 78.0–100.0)
PLATELETS: 151 10*3/uL (ref 150–400)
RBC: 3.93 MIL/uL (ref 3.87–5.11)
RDW: 16.1 % — ABNORMAL HIGH (ref 11.5–15.5)
WBC: 7.9 10*3/uL (ref 4.0–10.5)

## 2017-12-10 LAB — BASIC METABOLIC PANEL
Anion gap: 11 (ref 5–15)
BUN: 14 mg/dL (ref 8–23)
CHLORIDE: 108 mmol/L (ref 98–111)
CO2: 20 mmol/L — AB (ref 22–32)
CREATININE: 0.77 mg/dL (ref 0.44–1.00)
Calcium: 8.4 mg/dL — ABNORMAL LOW (ref 8.9–10.3)
GFR calc Af Amer: >60 mL/min (ref >60)
GFR calc non Af Amer: >60 mL/min (ref >60)
Glucose, Bld: 100 mg/dL — ABNORMAL HIGH (ref 70–99)
POTASSIUM: 4.1 mmol/L (ref 3.5–5.1)
Sodium: 139 mmol/L (ref 135–145)

## 2017-12-10 LAB — URINE CULTURE: Culture: >=100000 — AB

## 2017-12-10 MED ORDER — CEPHALEXIN 500 MG PO CAPS
500.0000 mg | ORAL_CAPSULE | Freq: Three times a day (TID) | ORAL | Status: DC
  Administered 2017-12-10: 500 mg via ORAL
  Filled 2017-12-10: qty 1

## 2017-12-10 MED ORDER — CEPHALEXIN 500 MG PO CAPS: 500.0000 mg | ORAL_CAPSULE | Freq: Three times a day (TID) | ORAL | 0 refills | Status: AC

## 2017-12-10 NOTE — Progress Notes (Signed)
Attempted report x2 to [facility]. RN will return my call.

## 2017-12-10 NOTE — Progress Notes (Signed)
Report called to Tammy, RN at [facility].

## 2017-12-10 NOTE — Progress Notes (Addendum)
Progress Note  Patient Name: Melanie Parker Date of Encounter: 12/10/2017  Primary Cardiologist: [facility], Dr. Donato Schultz, MD  Subjective   Pt feeling ok today. Biggest complaint is being cold. Denies chest pain or palpitations. No recurrent syncopal symptoms since admission.  Inpatient Medications    Scheduled Meds: . brimonidine  1 drop Right Eye TID  . divalproex  125 mg Oral BID BM  . divalproex  250 mg Oral QHS  . dorzolamide  1 drop Both Eyes BID  . enoxaparin (LOVENOX) injection  40 mg Subcutaneous Q24H  . escitalopram  5 mg Oral Daily  . free water  100 mL Oral Q4H  . latanoprost  1 drop Both Eyes QHS  . methimazole  5 mg Oral Daily   Continuous Infusions: . cefTRIAXone (ROCEPHIN)  IV 1 g (12/09/17 1955)  . dextrose 75 mL/hr at 12/09/17 1655   PRN Meds: acetaminophen **OR** acetaminophen, hydrALAZINE, ondansetron **OR** ondansetron (ZOFRAN) IV   Vital Signs    Vitals:   12/09/17 1455 12/09/17 1725 12/09/17 2300 12/10/17 0544  BP: (!) 143/78 (!) 148/61 138/60 131/64  Pulse: 67 (!) 53 (!) 57 63  Resp:  18 17 18   Temp:  98.4 F (36.9 C) 98 F (36.7 C) 98.4 F (36.9 C)  TempSrc:  Oral Oral Oral  SpO2:  98% 97% 99%  Weight:  56.6 kg    Height:  5\' 4"  (1.626 m)      Intake/Output Summary (Last 24 hours) at 12/10/2017 0727 Last data filed at 12/10/2017 0600 Gross per 24 hour  Intake 2478.38 ml  Output 901 ml  Net 1577.38 ml   Filed Weights   12/08/17 0124 12/09/17 0357 12/09/17 1725  Weight: 54.1 kg 54.7 kg 56.6 kg    Physical Exam   General: Elderly, frail,  NAD Skin: Warm, dry, intact  Head: Normocephalic, atraumatic, clear, moist mucus membranes. Neck: Negative for carotid bruits. No JVD Lungs:Clear to ausculation bilaterally. No wheezes, rales, or rhonchi. Breathing is unlabored. Cardiovascular: RRR with S1 S2. No murmurs, rubs, gallops, or LV heave appreciated. Abdomen: Soft, non-tender, non-distended with normoactive bowel sounds. No obvious  abdominal masses. MSK: Strength and tone appear normal for age. 5/5 in all extremities Extremities: No edema. No clubbing or cyanosis. DP/PT pulses 2+ bilaterally Neuro: Alert and oriented to self and place. No focal deficits. No facial asymmetry. MAE spontaneously. Psych: Responds to questions somewhat appropriately with normal affect.   Labs    Chemistry Recent Labs  Lab 12/08/17 0750 12/09/17 0557 12/10/17 0518  NA 147* 146* 139  K 3.8 4.8 4.1  CL 115* 113* 108  CO2 24 25 20*  GLUCOSE 91 109* 100*  BUN 20 23 14   CREATININE 0.96 1.20* 0.77  CALCIUM 8.8* 8.8* 8.4*  GFRNONAA 50* 38* >60  GFRAA 58* 44* >60  ANIONGAP 8 8 11     Hematology Recent Labs  Lab 12/07/17 1903 12/08/17 0750 12/09/17 0557  WBC 10.3 8.0 8.6  RBC 4.41 3.91 3.65*  HGB 12.1 10.7* 9.9*  HCT 40.3 34.6* 33.3*  MCV 91.4 88.5 91.2  MCH 27.4 27.4 27.1  MCHC 30.0 30.9 29.7*  RDW 17.1* 17.0* 17.2*  PLT 231 193 184   Cardiac Enzymes Recent Labs  Lab 12/07/17 1903 12/08/17 0233 12/08/17 0750 12/08/17 1416  TROPONINI 0.06* 0.08* 0.10* 0.08*    Recent Labs  Lab 12/07/17 2313  TROPIPOC 0.08    BNPNo results for input(s): BNP, PROBNP in the last 168 hours.  DDimer No results for input(s): DDIMER in the last 168 hours.   Radiology    No results found.  Telemetry    12/10/17 No currently on telemetry- Personally Reviewed  ECG    No new tracings as of 12/10/2017- Personally Reviewed  Cardiac Studies   Echocardiogram 12/09/2017: Study Conclusions  - Left ventricle: The cavity size was normal. There was moderate   concentric hypertrophy. Systolic function was vigorous. The   estimated ejection fraction was in the range of 65% to 70%. Wall   motion was normal; there were no regional wall motion   abnormalities. There was an increased relative contribution of   atrial contraction to ventricular filling. Doppler parameters are   consistent with abnormal left ventricular relaxation (grade  1   diastolic dysfunction). - Tricuspid valve: There was mild regurgitation.  Patient Profile     84 y.o. female with a hx of HTN, HLD, mild dementia and legally blind who is being seen today for the evaluation of 6 minutes of syncope at the request of Dr. Waymon Amato.  Assessment & Plan    1.  Syncope: -No recurrent syncopal symptoms or episodes -Not currently on telemetry>>will place order for continuous monitoring  -Avoid AV blocking agents  -Improving with IV hydration at 63ml/hr  -Echocardiogram with LVEF of 65 to 70% with no wall motion abnormalities and G1 DD with mild tricuspid regurgitation>>reassuring  2.  Elevated troponin: -Elevated, 0.06, 0.08, 0.10, 0.08>> downtrending and flat with no ACS symptoms>> likely in the setting of demand ischemia -EKG with inferior and lateral T wave inversions however, echocardiogram essentially normal with normal LV function and G1 DD as noted above -Denies ACS symptoms   3.  Mild dementia: -Stable, continue current management  4.  Hyperlipidemia: -Stable, last LDL 84  5.  AKI/UTI: -Creatinine, proving today at 0.77, 1.20 yesterday -Continue IV hydration and avoid nephrotoxic medications -Monitor with BMET -Antibiotics per primary team  6.  Hypothyroidism: -Stable, medically managed with Tapazole  Signed, Georgie Chard NP-C [facility] Pager: [phone] 12/10/2017, 7:27 AM     For questions or updates, please contact   Please consult [email or web address] for contact info under Cardiology/STEMI.  Personally seen and examined. Agree with above. Resting. No complaints  No indication for pacemaker.  Timolol eyedrops have been discontinued as well.  AKI improved with hydration.  Unknown reason for unresponsiveness.  Continue to help with underlying dementia.  [facility] will sign off.   Medication Recommendations:  None. Stopped timolol Other recommendations (labs, testing, etc):  none Follow up as an outpatient:  none

## 2017-12-10 NOTE — Clinical Social Work Note (Signed)
Clinical Social Work Assessment  Patient Details  Name: Melanie Parker MRN: [id number] Date of Birth: 1923/05/03  Date of referral:  12/10/17               Reason for consult:  Facility Placement                Permission sought to share information with:  Facility Medical sales representative, Family Supports Permission granted to share information::  No(pt only oriented to self)  Name::     Merrill Deanda, son, [phone]  Agency::  [facility]  Relationship::  son  Contact Information:  [phone]  Housing/Transportation Living arrangements for the past 2 months:   Skilled Nursing Facility Source of Information:  Patient Patient Interpreter Needed:  None Criminal Activity/Legal Involvement Pertinent to Current Situation/Hospitalization:  No - Comment as needed Significant Relationships:  Adult Children Lives with:  Facility Resident Do you feel safe going back to the place where you live?  Yes Need for family participation in patient care:  Yes (Comment)  Care giving concerns:  Pt is LTC from [facility], wheelchair bound at baseline and reliant for ADLs and IADLs. Pt blind and has memory loss.   Social Worker assessment / plan:  CSW spoke with pt son Archie Patten, he has been following pt progress and is aware pt ready for discharge. Pt son confirms pt is LTC at [facility] and that she will return there today.  Employment status:  Retired Health and safety inspector:  Medicare PT Recommendations:  Skilled Nursing Facility Information / Referral to community resources:  Skilled Nursing Facility  Patient/Family's Response to care:  Pt son amenable to pt returning to SNF LTC.  Patient/Family's Understanding of and Emotional Response to Diagnosis, Current Treatment, and Prognosis: Pt son states understanding of diagnosis, current treatment and prognosis. Hard to determine emotional response to CSW since we spoke on telephone, however was appropriate and knowledgeable about pt  health history and expresses reasonable understanding of pt needs.   Emotional Assessment Appearance:  Appears stated age Attitude/Demeanor/Rapport:  Unable to Assess Affect (typically observed):  Unable to Assess Orientation:  Oriented to Self, Fluctuating Orientation (Suspected and/or reported Sundowners) Alcohol / Substance use:  Not Applicable Psych involvement (Current and /or in the community):  No (Comment)  Discharge Needs  Concerns to be addressed:  Care Coordination Readmission within the last 30 days:  No Current discharge risk:  Cognitively Impaired, Dependent with Mobility Barriers to Discharge:  Continued Medical Work up   [employer], LCSWA 12/10/2017, 1:06 PM

## 2017-12-10 NOTE — Discharge Summary (Signed)
Physician Discharge Summary  Melanie Parker ZOX:[id number] DOB: 1923/04/18 DOA: 12/07/2017  PCP: Jarome Matin, MD  Admit date: 12/07/2017 Discharge date: 12/10/2017  Admitted From: SNF  Disposition:  SNF  Recommendations for Outpatient Follow-up and new medication changes:  1. Follow up with Dr. Eloise Harman in 7 days.  2. Patient has been placed on Cephalexin for 4 more days. 3. Discontinue lisinopril to prevent hypotension. 4. Discontinue timolol eye drops due to bradycardia.   Home Health: na   Equipment/Devices: wheelchair    Discharge Condition: stable  CODE STATUS: full  Diet recommendation: Heart healthy   Brief/Interim Summary: 84 year old female who presented after a syncope episode.  She does have a significant past medical history for hypertension, dementia, hyperthyroidism and legal blindness.  Patient had a syncope episode while sitting in her wheelchair, associated with hypotension.  On her initial physical examination blood pressure 156/67, heart rate 70, respiratory rate 17, oxygen saturation 98%.  Patient was awake and alert, her lungs clear to auscultation bilaterally, heart S1-S2 present rhythmic, abdomen soft, no lower extremity edema.  Sodium 144, potassium 3.3, chloride 109, bicarb 23, glucose 135, BUN 23, creatinine 1.15, troponin 0.06, white cell count 10.3, hemoglobin 12.1, hematocrit 40.3, platelets 231.  Urinalysis with greater than 50 white cells, 21-50 red cells.  Head CT with no acute changes.  Chest x-ray with no infiltrates.  EKG had T wave inversions in lead III, aVF, V5 and V6/ new.   Patient was admitted to the hospital with working diagnosis of syncope, associated hypotension and urinary tract infection, complicated by elevated troponins and acute kidney injury.  1.  Syncope.  Likely related to hypotension in the setting of urinary tract infection and dehydration.  She received IV fluids, no further syncope episodes.  Her antihypertensive agents were held.   Telemetry showed bradycardia, asymptomatic, heart rate down to 49 bpm, timolol eye drops was discontinued.  2.  Urinary tract infection, present on admission.  Patient was treated with IV ceftriaxone with improvement of her symptoms, urine culture was positive for aerococcus more than 100,000 colony-forming units.  Patient will be discharged on cephalexin for the next 4 days.   3.  Elevated troponins due to demand ischemia.  Patient ruled out for an acute coronary syndrome.  Troponin trending down, no chest pain.  Further work-up with echocardiography showed normal LV systolic function, no wall motion normalities or valvulopathy.   4.  Prerenal acute kidney injury, with hypernatremia.  Patient received IV fluids, her discharge creatinine 0.77, bicarbonate of 20, sodium 139, potassium 4.1.  Patient tolerating well p.o. diet at discharge.  5.  Hypertension.  Antihypertensive agents were held during her hospitalization, at discharge patient will resume amlodipine, lisinopril will be discontinued.  Discharge blood pressure 131-138 mmHg systolic.  6.  Dementia, legal blindness.  Patient was continued on Depakote and Lexapro.  7.  Hyperthyroidism.  Clinically euthyroid, continue methimazole.  8.  Chronic iron deficiency anemia.  Remained stable, no signs of bleeding.  Discharge hemoglobin 10.6. Continue Iron supplements.   9. Dyslipidemia. Continue atorvastatin.    Discharge Diagnoses:  Principal Problem:   Syncope Active Problems:   Hyperthyroidism   Osteoarthrosis involving lower leg   Essential hypertension   PAD (peripheral artery disease) (HCC)   Stage III pressure ulcer of sacral region [facility])   Acute lower UTI    Discharge Instructions   Allergies as of 12/10/2017      Reactions   Ivp Dye [iodinated Diagnostic Agents] Anaphylaxis   Iodine Other (  See Comments)   On MAR   Naprosyn [naproxen]       Medication List    STOP taking these medications   docusate sodium 100 MG  capsule Commonly known as:  COLACE   lisinopril 20 MG tablet Commonly known as:  PRINIVIL,ZESTRIL   timolol 0.5 % ophthalmic solution Commonly known as:  TIMOPTIC     TAKE these medications   amLODipine 10 MG tablet Commonly known as:  NORVASC Take 10 mg by mouth daily. For blood pressure   atorvastatin 10 MG tablet Commonly known as:  LIPITOR Take 10 mg by mouth daily. For high  cholesterol   brimonidine 0.15 % ophthalmic solution Commonly known as:  ALPHAGAN Place 1 drop into the right eye 3 (three) times daily.   cephALEXin 500 MG capsule Commonly known as:  KEFLEX Take 1 capsule (500 mg total) by mouth every 8 (eight) hours for 4 days.   CoQ-10 10 MG Caps Take 10 mg by mouth every other day.   divalproex 125 MG capsule Commonly known as:  DEPAKOTE SPRINKLE Take 125-250 mg by mouth See admin instructions. Take 1 capsule every morning and at 1400 then take 2 capsules at bedtime   dorzolamide 2 % ophthalmic solution Commonly known as:  TRUSOPT Place 1 drop into both eyes 2 (two) times daily.   escitalopram 5 MG tablet Commonly known as:  LEXAPRO Take 5 mg by mouth daily.   feeding supplement Liqd Commonly known as:  BOOST / RESOURCE BREEZE Take 1 Container by mouth 2 (two) times daily between meals.   iron polysaccharides 150 MG capsule Commonly known as:  NIFEREX Take 150 mg by mouth 2 (two) times daily.   latanoprost 0.005 % ophthalmic solution Commonly known as:  XALATAN Place 1 drop into both eyes at bedtime.   meloxicam 15 MG tablet Commonly known as:  MOBIC Take 1 tablet (15 mg total) by mouth daily.   methimazole 5 MG tablet Commonly known as:  TAPAZOLE Take 1 tablet (5 mg total) by mouth daily. For hyperthyroidism   NEXABIOTIC PO Take 1 capsule by mouth daily.   OMEGA-3 FUSION Liqd Take 15 mLs by mouth daily.   OVER THE COUNTER MEDICATION Take 2.5 mLs by mouth daily. [place] Mene   sennosides-docusate sodium 8.6-50 MG tablet Commonly  known as:  SENOKOT-S Take 2 tablets by mouth at bedtime.       Allergies  Allergen Reactions  . Ivp Dye [Iodinated Diagnostic Agents] Anaphylaxis  . Iodine Other (See Comments)    On MAR  . Naprosyn [Naproxen]     Consultations:  Cardiology    Procedures/Studies: Dg Chest 2 View  Result Date: 12/07/2017 CLINICAL DATA:  Syncope EXAM: CHEST - 2 VIEW COMPARISON:  None. FINDINGS: Heart and mediastinal contours are within normal limits. No focal opacities or effusions. No acute bony abnormality. IMPRESSION: No active cardiopulmonary disease. Electronically Signed   By: Charlett Nose M.D.   On: 12/07/2017 20:15   Ct Head Wo Contrast  Result Date: 12/07/2017 CLINICAL DATA:  Syncope EXAM: CT HEAD WITHOUT CONTRAST TECHNIQUE: Contiguous axial images were obtained from the base of the skull through the vertex without intravenous contrast. COMPARISON:  None. FINDINGS: Brain: There is atrophy and chronic small vessel disease changes. No acute intracranial abnormality. Specifically, no hemorrhage, hydrocephalus, mass lesion, acute infarction, or significant intracranial injury. Vascular: No hyperdense vessel or unexpected calcification. Skull: No acute calvarial abnormality. Sinuses/Orbits: Visualized paranasal sinuses and mastoids clear. Orbital soft tissues unremarkable. Other: None  IMPRESSION: No acute intracranial abnormality. Atrophy, chronic microvascular disease. Electronically Signed   By: Charlett Nose M.D.   On: 12/07/2017 21:04       Subjective: Patient is feeling well, no chest pain or dyspnea, no nausea or vomiting.   Discharge Exam: Vitals:   12/09/17 2300 12/10/17 0544  BP: 138/60 131/64  Pulse: (!) 57 63  Resp: 17 18  Temp: 98 F (36.7 C) 98.4 F (36.9 C)  SpO2: 97% 99%   Vitals:   12/09/17 1455 12/09/17 1725 12/09/17 2300 12/10/17 0544  BP: (!) 143/78 (!) 148/61 138/60 131/64  Pulse: 67 (!) 53 (!) 57 63  Resp:  18 17 18   Temp:  98.4 F (36.9 C) 98 F (36.7 C)  98.4 F (36.9 C)  TempSrc:  Oral Oral Oral  SpO2:  98% 97% 99%  Weight:  56.6 kg    Height:  5\' 4"  (1.626 m)      General: deconditioned  Neurology: Awake and alert, non focal  E ENT: mild pallor, no icterus, oral mucosa moist Cardiovascular: No JVD. S1-S2 present, rhythmic, no gallops, rubs, or murmurs. No lower extremity edema. Pulmonary: positive breath sounds bilaterally, adequate air movement, no wheezing, rhonchi or rales. Gastrointestinal. Abdomen with no organomegaly, non tender, no rebound or guarding Skin. No rashes Musculoskeletal: no joint deformities   The results of significant diagnostics from this hospitalization (including imaging, microbiology, ancillary and laboratory) are listed below for reference.     Microbiology: Recent Results (from the past 240 hour(s))  Urine culture     Status: Abnormal   Collection Time: 12/07/17 10:43 PM  Result Value Ref Range Status   Specimen Description URINE, CATHETERIZED  Final   Special Requests NONE  Final   Culture (A)  Final    >=100,000 COLONIES/mL AEROCOCCUS SPECIES Standardized susceptibility testing for this organism is not available. Performed at [facility], [address], [place], [place] [address]    Report Status 12/10/2017 FINAL  Final  MRSA PCR Screening     Status: None   Collection Time: 12/08/17  1:48 AM  Result Value Ref Range Status   MRSA by PCR NEGATIVE NEGATIVE Final    Comment:        The GeneXpert MRSA Assay (FDA approved for NASAL specimens only), is one component of a comprehensive MRSA colonization surveillance program. It is not intended to diagnose MRSA infection nor to guide or monitor treatment for MRSA infections. Performed at [facility], [address], [place], [place] [address]      Labs: BNP (last 3 results) No results for input(s): BNP in the last 8760 hours. Basic Metabolic Panel: Recent Labs  Lab 12/07/17 1903 12/08/17 0750 12/09/17 0557  12/10/17 0518  NA 144 147* 146* 139  K 3.3* 3.8 4.8 4.1  CL 109 115* 113* 108  CO2 23 24 25  20*  GLUCOSE 135* 91 109* 100*  BUN 23 20 23 14   CREATININE 1.15* 0.96 1.20* 0.77  CALCIUM 9.1 8.8* 8.8* 8.4*   Liver Function Tests: No results for input(s): AST, ALT, ALKPHOS, BILITOT, PROT, ALBUMIN in the last 168 hours. No results for input(s): LIPASE, AMYLASE in the last 168 hours. No results for input(s): AMMONIA in the last 168 hours. CBC: Recent Labs  Lab 12/07/17 1903 12/08/17 0750 12/09/17 0557 12/10/17 0518  WBC 10.3 8.0 8.6 7.9  HGB 12.1 10.7* 9.9* 10.6*  HCT 40.3 34.6* 33.3* 33.6*  MCV 91.4 88.5 91.2 85.5  PLT 231  193 184 151   Cardiac Enzymes: Recent Labs  Lab 12/07/17 1903 12/08/17 0233 12/08/17 0750 12/08/17 1416  TROPONINI 0.06* 0.08* 0.10* 0.08*   BNP: Invalid input(s): POCBNP CBG: Recent Labs  Lab 12/07/17 2024  GLUCAP 114*   D-Dimer No results for input(s): DDIMER in the last 72 hours. Hgb A1c No results for input(s): HGBA1C in the last 72 hours. Lipid Profile No results for input(s): CHOL, HDL, LDLCALC, TRIG, CHOLHDL, LDLDIRECT in the last 72 hours. Thyroid function studies Recent Labs    12/08/17 0638  TSH 0.552   Anemia work up No results for input(s): VITAMINB12, FOLATE, FERRITIN, TIBC, IRON, RETICCTPCT in the last 72 hours. Urinalysis    Component Value Date/Time   COLORURINE YELLOW 12/07/2017 2243   APPEARANCEUR TURBID (A) 12/07/2017 2243   APPEARANCEUR Cloudy 03/09/2015   APPEARANCEUR Cloudy (A) 06/03/2014 1200   LABSPEC 1.014 12/07/2017 2243   LABSPEC 1.01 03/09/2015   PHURINE 7.0 12/07/2017 2243   GLUCOSEU NEGATIVE 12/07/2017 2243   HGBUR MODERATE (A) 12/07/2017 2243   BILIRUBINUR NEGATIVE 12/07/2017 2243   BILIRUBINUR Negative 06/03/2014 1200   KETONESUR 5 (A) 12/07/2017 2243   PROTEINUR 100 (A) 12/07/2017 2243   UROBILINOGEN Normal 03/09/2015   NITRITE NEGATIVE 12/07/2017 2243   LEUKOCYTESUR MODERATE (A) 12/07/2017 2243    LEUKOCYTESUR 4+ (A) 03/09/2015   Sepsis Labs Invalid input(s): PROCALCITONIN,  WBC,  LACTICIDVEN Microbiology Recent Results (from the past 240 hour(s))  Urine culture     Status: Abnormal   Collection Time: 12/07/17 10:43 PM  Result Value Ref Range Status   Specimen Description URINE, CATHETERIZED  Final   Special Requests NONE  Final   Culture (A)  Final    >=100,000 COLONIES/mL AEROCOCCUS SPECIES Standardized susceptibility testing for this organism is not available. Performed at [facility], [address], [place], [place] [address]    Report Status 12/10/2017 FINAL  Final  MRSA PCR Screening     Status: None   Collection Time: 12/08/17  1:48 AM  Result Value Ref Range Status   MRSA by PCR NEGATIVE NEGATIVE Final    Comment:        The GeneXpert MRSA Assay (FDA approved for NASAL specimens only), is one component of a comprehensive MRSA colonization surveillance program. It is not intended to diagnose MRSA infection nor to guide or monitor treatment for MRSA infections. Performed at [facility], [address] [place], [place] [address]      Time coordinating discharge: 45 minutes  SIGNED:   Coralie Keens, MD  [facility] 12/10/2017, 10:20 AM Pager [phone]  If 7PM-7AM, please contact night-coverage [email or web address] Password [email or web address]

## 2017-12-10 NOTE — Clinical Social Work Placement (Signed)
   CLINICAL SOCIAL WORK PLACEMENT  NOTE [facility]  Date:  12/10/2017  Patient Details  Name: Melanie Parker MRN: [id number] Date of Birth: November 01, 1923  Clinical Social Work is seeking post-discharge placement for this patient at the Skilled  Nursing Facility level of care (*CSW will initial, date and re-position this form in  chart as items are completed):  Yes   Patient/family provided with [facility] Clinical Social Work Department's list of facilities offering this level of care within the geographic area requested by the patient (or if unable, by the patient's family).  Yes   Patient/family informed of their freedom to choose among providers that offer the needed level of care, that participate in Medicare, Medicaid or managed care program needed by the patient, have an available bed and are willing to accept the patient.  Yes   Patient/family informed of [facility]'s ownership interest in [facility] and [facility], as well as of the fact that they are under no obligation to receive care at these facilities.  PASRR submitted to EDS on       PASRR number received on       Existing PASRR number confirmed on 12/09/17     FL2 transmitted to all facilities in geographic area requested by pt/family on 12/09/17     FL2 transmitted to all facilities within larger geographic area on       Patient informed that his/her managed care company has contracts with or will negotiate with certain facilities, including the following:        Yes   Patient/family informed of bed offers received.  Patient chooses bed at [facility]     Physician recommends and patient chooses bed at      Patient to be transferred to [facility] on 12/10/17.  Patient to be transferred to facility by [facility]     Patient family notified on 12/10/17 of transfer.  Name of family member notified:  pt son [place]     PHYSICIAN       Additional Comment:     _______________________________________________ Doy Hutching, LCSWA 12/10/2017, 1:30 PM

## 2017-12-10 NOTE — Progress Notes (Signed)
Order place for telemetry. Attempted to place tele on patient, she will not allow [place] to place tele on her.

## 2017-12-10 NOTE — Social Work (Signed)
Clinical Social Worker facilitated patient discharge including contacting patient family and facility to confirm patient discharge plans.  Clinical information faxed to facility and family agreeable with plan.  CSW arranged ambulance transport via [facility] to [facility] RN to call [phone] with report prior to discharge.  Clinical Social Worker will sign off for now as social work intervention is no longer needed. Please consult us again if new need arises.  Raiden Haydu H Cece Milhouse, LCSWA Clinical Social Worker  

## 2017-12-10 NOTE — Plan of Care (Signed)

## 2017-12-11 NOTE — Consult Note (Signed)
            [facility] CM Primary Care Navigator  12/11/2017  Shamariah Shewmake 10/07/1923 [id number]   Went to see patientat the bedsideto identify possible discharge needs butshewasalreadydischarge perstaff report.  Patientwas discharged to skilled nursing facility per therapy recommendation (SNF- [facility]).  Per MD note,patientpresented after a syncope episode and was admitted to the hospital with working diagnosis of syncope associated with hypotension and acute lower urinary tract infection, complicated by elevated troponins and acute kidney injury.  Patient has discharge instruction to follow-up with primary care provider in 7 days.  Primary care provider's office is listed as providing transition of care (TOC) follow-up.   For additional questions please contact:  Karin Golden A. Mckala Pantaleon, BSN, RN-BC [facility] PRIMARY CARE Navigator Cell: [phone]

## 2017-12-14 DIAGNOSIS — E440 Moderate protein-calorie malnutrition: Secondary | ICD-10-CM | POA: Diagnosis not present

## 2017-12-14 DIAGNOSIS — R4182 Altered mental status, unspecified: Secondary | ICD-10-CM | POA: Diagnosis not present

## 2017-12-14 DIAGNOSIS — I10 Essential (primary) hypertension: Secondary | ICD-10-CM | POA: Diagnosis not present

## 2017-12-14 DIAGNOSIS — E7849 Other hyperlipidemia: Secondary | ICD-10-CM | POA: Diagnosis not present

## 2017-12-14 DIAGNOSIS — G309 Alzheimer's disease, unspecified: Secondary | ICD-10-CM | POA: Diagnosis not present

## 2017-12-14 DIAGNOSIS — H4089 Other specified glaucoma: Secondary | ICD-10-CM | POA: Diagnosis not present

## 2017-12-14 DIAGNOSIS — S069X9A Unspecified intracranial injury with loss of consciousness of unspecified duration, initial encounter: Secondary | ICD-10-CM | POA: Diagnosis not present

## 2017-12-17 DIAGNOSIS — F411 Generalized anxiety disorder: Secondary | ICD-10-CM | POA: Diagnosis not present

## 2017-12-17 DIAGNOSIS — F0391 Unspecified dementia with behavioral disturbance: Secondary | ICD-10-CM | POA: Diagnosis not present

## 2017-12-17 DIAGNOSIS — G309 Alzheimer's disease, unspecified: Secondary | ICD-10-CM | POA: Diagnosis not present

## 2018-01-06 DIAGNOSIS — F0390 Unspecified dementia without behavioral disturbance: Secondary | ICD-10-CM | POA: Diagnosis not present

## 2018-01-06 DIAGNOSIS — R569 Unspecified convulsions: Secondary | ICD-10-CM | POA: Diagnosis not present

## 2018-01-06 DIAGNOSIS — F0391 Unspecified dementia with behavioral disturbance: Secondary | ICD-10-CM | POA: Diagnosis not present

## 2018-02-16 DIAGNOSIS — B351 Tinea unguium: Secondary | ICD-10-CM | POA: Diagnosis not present

## 2018-02-16 DIAGNOSIS — I739 Peripheral vascular disease, unspecified: Secondary | ICD-10-CM | POA: Diagnosis not present

## 2018-02-17 DIAGNOSIS — S31829A Unspecified open wound of left buttock, initial encounter: Secondary | ICD-10-CM | POA: Diagnosis not present

## 2018-02-17 DIAGNOSIS — L89152 Pressure ulcer of sacral region, stage 2: Secondary | ICD-10-CM | POA: Diagnosis not present

## 2018-02-22 DIAGNOSIS — E440 Moderate protein-calorie malnutrition: Secondary | ICD-10-CM | POA: Diagnosis not present

## 2018-02-22 DIAGNOSIS — H4089 Other specified glaucoma: Secondary | ICD-10-CM | POA: Diagnosis not present

## 2018-02-22 DIAGNOSIS — G309 Alzheimer's disease, unspecified: Secondary | ICD-10-CM | POA: Diagnosis not present

## 2018-03-05 ENCOUNTER — Emergency Department (HOSPITAL_COMMUNITY)
Admission: EM | Admit: 2018-03-05 | Discharge: 2018-03-05 | Disposition: A | Discharge status: Other Acute Inpatient Hospital | Payer: Medicare Other | Attending: Emergency Medicine | Admitting: Emergency Medicine

## 2018-03-05 ENCOUNTER — Emergency Department (HOSPITAL_COMMUNITY): Payer: Medicare Other

## 2018-03-05 ENCOUNTER — Encounter (HOSPITAL_COMMUNITY): Payer: Self-pay

## 2018-03-05 ENCOUNTER — Other Ambulatory Visit: Payer: Self-pay

## 2018-03-05 DIAGNOSIS — I10 Essential (primary) hypertension: Secondary | ICD-10-CM | POA: Diagnosis not present

## 2018-03-05 DIAGNOSIS — F0390 Unspecified dementia without behavioral disturbance: Secondary | ICD-10-CM | POA: Diagnosis not present

## 2018-03-05 DIAGNOSIS — Z79899 Other long term (current) drug therapy: Secondary | ICD-10-CM | POA: Diagnosis not present

## 2018-03-05 DIAGNOSIS — Z743 Need for continuous supervision: Secondary | ICD-10-CM | POA: Diagnosis not present

## 2018-03-05 DIAGNOSIS — N3000 Acute cystitis without hematuria: Secondary | ICD-10-CM | POA: Insufficient documentation

## 2018-03-05 DIAGNOSIS — R279 Unspecified lack of coordination: Secondary | ICD-10-CM | POA: Diagnosis not present

## 2018-03-05 DIAGNOSIS — R4182 Altered mental status, unspecified: Secondary | ICD-10-CM | POA: Diagnosis not present

## 2018-03-05 DIAGNOSIS — J986 Disorders of diaphragm: Secondary | ICD-10-CM | POA: Diagnosis not present

## 2018-03-05 DIAGNOSIS — R109 Unspecified abdominal pain: Secondary | ICD-10-CM | POA: Insufficient documentation

## 2018-03-05 DIAGNOSIS — R404 Transient alteration of awareness: Secondary | ICD-10-CM | POA: Diagnosis not present

## 2018-03-05 DIAGNOSIS — R1084 Generalized abdominal pain: Secondary | ICD-10-CM | POA: Diagnosis not present

## 2018-03-05 DIAGNOSIS — F17290 Nicotine dependence, other tobacco product, uncomplicated: Secondary | ICD-10-CM | POA: Diagnosis not present

## 2018-03-05 DIAGNOSIS — R410 Disorientation, unspecified: Secondary | ICD-10-CM | POA: Diagnosis not present

## 2018-03-05 DIAGNOSIS — R52 Pain, unspecified: Secondary | ICD-10-CM | POA: Diagnosis not present

## 2018-03-05 DIAGNOSIS — K5730 Diverticulosis of large intestine without perforation or abscess without bleeding: Secondary | ICD-10-CM | POA: Diagnosis not present

## 2018-03-05 DIAGNOSIS — R0602 Shortness of breath: Secondary | ICD-10-CM | POA: Diagnosis not present

## 2018-03-05 DIAGNOSIS — H401133 Primary open-angle glaucoma, bilateral, severe stage: Secondary | ICD-10-CM | POA: Diagnosis not present

## 2018-03-05 DIAGNOSIS — R0902 Hypoxemia: Secondary | ICD-10-CM | POA: Diagnosis not present

## 2018-03-05 LAB — CBC WITH DIFFERENTIAL/PLATELET
ABS IMMATURE GRANULOCYTES: 0.12 10*3/uL — AB (ref 0.00–0.07)
BASOS PCT: 1 %
Basophils Absolute: 0.1 10*3/uL (ref 0.0–0.1)
EOS PCT: 0 %
Eosinophils Absolute: 0 10*3/uL (ref 0.0–0.5)
HCT: 43.7 % (ref 36.0–46.0)
Hemoglobin: 13.3 g/dL (ref 12.0–15.0)
Immature Granulocytes: 1 %
Lymphocytes Relative: 19 %
Lymphs Abs: 1.7 10*3/uL (ref 0.7–4.0)
MCH: 28.3 pg (ref 26.0–34.0)
MCHC: 30.4 g/dL (ref 30.0–36.0)
MCV: 93 fL (ref 80.0–100.0)
MONO ABS: 0.6 10*3/uL (ref 0.1–1.0)
MONOS PCT: 7 %
NEUTROS ABS: 6.4 10*3/uL (ref 1.7–7.7)
Neutrophils Relative %: 72 %
PLATELETS: 203 10*3/uL (ref 150–400)
RBC: 4.7 MIL/uL (ref 3.87–5.11)
RDW: 15.2 % (ref 11.5–15.5)
WBC: 8.9 10*3/uL (ref 4.0–10.5)
nRBC: 0 % (ref 0.0–0.2)

## 2018-03-05 LAB — COMPREHENSIVE METABOLIC PANEL
ALT: 15 U/L (ref 0–44)
AST: 23 U/L (ref 15–41)
Albumin: 3.3 g/dL — ABNORMAL LOW (ref 3.5–5.0)
Alkaline Phosphatase: 66 U/L (ref 38–126)
Anion gap: 11 (ref 5–15)
BUN: 26 mg/dL — ABNORMAL HIGH (ref 8–23)
CALCIUM: 9.6 mg/dL (ref 8.9–10.3)
CHLORIDE: 109 mmol/L (ref 98–111)
CO2: 23 mmol/L (ref 22–32)
CREATININE: 1.35 mg/dL — AB (ref 0.44–1.00)
GFR, EST AFRICAN AMERICAN: 39 mL/min — AB (ref >60)
GFR, EST NON AFRICAN AMERICAN: 34 mL/min — AB (ref >60)
Glucose, Bld: 127 mg/dL — ABNORMAL HIGH (ref 70–99)
Potassium: 4.1 mmol/L (ref 3.5–5.1)
Sodium: 143 mmol/L (ref 135–145)
Total Bilirubin: 0.7 mg/dL (ref 0.3–1.2)
Total Protein: 7.2 g/dL (ref 6.5–8.1)

## 2018-03-05 LAB — URINALYSIS, ROUTINE W REFLEX MICROSCOPIC
BILIRUBIN URINE: NEGATIVE
GLUCOSE, UA: NEGATIVE mg/dL
Hgb urine dipstick: NEGATIVE
Ketones, ur: 15 mg/dL — AB
Nitrite: POSITIVE — AB
PROTEIN: NEGATIVE mg/dL
Specific Gravity, Urine: 1.015 (ref 1.005–1.030)
pH: 6 (ref 5.0–8.0)

## 2018-03-05 LAB — URINALYSIS, MICROSCOPIC (REFLEX)

## 2018-03-05 LAB — LIPASE, BLOOD: LIPASE: 28 U/L (ref 11–51)

## 2018-03-05 MED ORDER — LACTATED RINGERS IV BOLUS: 1000.0000 mL | Freq: Once | INTRAVENOUS | Status: DC

## 2018-03-05 MED ORDER — LACTATED RINGERS IV BOLUS
1000.0000 mL | Freq: Once | INTRAVENOUS | Status: AC
  Administered 2018-03-05: 1000 mL via INTRAVENOUS

## 2018-03-05 MED ORDER — CEPHALEXIN 500 MG PO CAPS: 500.0000 mg | ORAL_CAPSULE | Freq: Four times a day (QID) | ORAL | 0 refills | Status: AC

## 2018-03-05 NOTE — ED Notes (Signed)
Patient transported to X-ray 

## 2018-03-05 NOTE — ED Triage Notes (Signed)
Pt arrives to ED from an eye appointment for assessment of POAG. Staff there called 911 because of complaints of severe abdominal pain as reported by pt, since resolved. EMS reports pt was 76% on room air upon arrival to doctor's office, was then placed on 3L and it brought her up to 94%, 100% at time of arrival. Pt alert and oriented to person and occasionally time. [facility]. Pt placed in position of comfort with bed locked and lowered, call bell in reach.

## 2018-03-05 NOTE — ED Notes (Signed)
[facility] CALLED  °

## 2018-03-05 NOTE — ED Provider Notes (Signed)
[facility] EMERGENCY DEPARTMENT Provider Note   CSN: [id number] Arrival date & time: 03/05/18  1453     History   Chief Complaint Chief Complaint  Patient presents with  . Abdominal Pain    Resolved  . Shortness of Breath    HPI Melanie Parker is a 84 y.o. female.  The history is provided by the patient and a relative. No language interpreter was used.  Abdominal Pain   This is a recurrent problem. The problem occurs every several days. The problem has been resolved. The pain is associated with eating. Associated symptoms include constipation. Pertinent negatives include fever, diarrhea, flatus, hematochezia, melena, nausea, vomiting, dysuria, headaches and myalgias. Nothing aggravates the symptoms. Nothing relieves the symptoms.  history is primarily provided by the patient's son who sees her regularly as the patient is unable to provide any further details secondary to her dementia.  Past Medical History:  Diagnosis Date  . Arthritis   . Carpal tunnel syndrome   . Cataract, right   . Glaucoma   . High blood pressure   . High cholesterol   . History of recurrent UTI (urinary tract infection)   . Mild dementia (HCC)   . Overactive thyroid gland   . Spinal stenosis     Patient Active Problem List   Diagnosis Date Noted  . Acute lower UTI 12/08/2017  . Syncope 12/07/2017  . Arthritis 07/15/2015  . Vascular dementia without behavioral disturbance (HCC) 02/02/2015  . Stage III pressure ulcer of sacral region (HCC) 11/24/2014  . Tobacco use 11/24/2014  . Osteoarthritis of multiple joints 07/15/2014  . Urinary incontinence in female 02/09/2014  . PAD (peripheral artery disease) (HCC) 02/09/2014  . Insomnia 01/11/2014  . Cold intolerance 01/11/2014  . Hyperthyroidism 01/11/2014  . Osteoarthrosis involving lower leg 01/11/2014  . CN (constipation) 01/11/2014  . Glaucoma 01/11/2014  . Essential hypertension 01/11/2014  . Hyperlipidemia 01/11/2014      Past Surgical History:  Procedure Laterality Date  . CARPAL TUNNEL RELEASE    . CATARACT EXTRACTION  2000     OB History   No obstetric history on file.      Home Medications    Prior to Admission medications   Medication Sig Start Date End Date Taking? Authorizing Provider  amLODipine (NORVASC) 10 MG tablet Take 10 mg by mouth daily. For blood pressure    [provider]  atorvastatin (LIPITOR) 10 MG tablet Take 10 mg by mouth daily. For high  cholesterol    [provider]  brimonidine (ALPHAGAN) 0.15 % ophthalmic solution Place 1 drop into the right eye 3 (three) times daily.     [provider]  cephALEXin (KEFLEX) 500 MG capsule Take 1 capsule (500 mg total) by mouth 4 (four) times daily for 7 days. 03/05/18 03/12/18  Antoine Primas, MD  Coenzyme Q10 (COQ-10) 10 MG CAPS Take 10 mg by mouth every other day.    [provider]  divalproex (DEPAKOTE SPRINKLE) 125 MG capsule Take 125-250 mg by mouth See admin instructions. Take 1 capsule every morning and at 1400 then take 2 capsules at bedtime    [provider]  dorzolamide (TRUSOPT) 2 % ophthalmic solution Place 1 drop into both eyes 2 (two) times daily.    [provider]  escitalopram (LEXAPRO) 5 MG tablet Take 5 mg by mouth daily.    [provider]  feeding supplement (BOOST / RESOURCE BREEZE) LIQD Take 1 Container by mouth 2 (two) times  daily between meals.    [provider]  iron polysaccharides (NIFEREX) 150 MG capsule Take 150 mg by mouth 2 (two) times daily.    [provider]  latanoprost (XALATAN) 0.005 % ophthalmic solution Place 1 drop into both eyes at bedtime.    [provider]  meloxicam (MOBIC) 15 MG tablet Take 1 tablet (15 mg total) by mouth daily. 07/15/14   Kirt Boys, DO  methimazole (TAPAZOLE) 5 MG tablet Take 1 tablet (5 mg total) by mouth daily. For hyperthyroidism 03/14/14   Reed, Tiffany L, DO  Omega 3-6-9  Fatty Acids (OMEGA-3 FUSION) LIQD Take 15 mLs by mouth daily.    [provider]  OVER THE COUNTER MEDICATION Take 2.5 mLs by mouth daily. [facility]    [provider]  Probiotic Product (NEXABIOTIC PO) Take 1 capsule by mouth daily.    [provider]  sennosides-docusate sodium (SENOKOT-S) 8.6-50 MG tablet Take 2 tablets by mouth at bedtime.    [provider]    Family History Family History  Problem Relation Age of Onset  . Arthritis Mother   . Heart disease Mother   . Heart disease Father   . Lung disease Brother   . Cancer Brother        Prostate     Social History Social History   Tobacco Use  . Smoking status: Never Smoker  . Smokeless tobacco: Current User    Types: Snuff  Substance Use Topics  . Alcohol use: No    Alcohol/week: 0.0 standard drinks  . Drug use: No     Allergies   Ivp dye [iodinated diagnostic agents]; Iodine; and Naprosyn [naproxen]   Review of Systems Review of Systems  Unable to perform ROS: Dementia  Constitutional: Negative for fever.  Gastrointestinal: Positive for abdominal pain and constipation. Negative for diarrhea, flatus, hematochezia, melena, nausea and vomiting.  Genitourinary: Negative for dysuria.  Musculoskeletal: Negative for myalgias.  Neurological: Negative for headaches.     Physical Exam Updated Vital Signs BP (!) 145/53   Pulse 69   Temp 98.3 F (36.8 C) (Oral)   Resp 16   SpO2 100%   Physical Exam Vitals signs and nursing note reviewed.  Constitutional:      General: She is not in acute distress.    Appearance: She is well-developed.  HENT:     Head: Normocephalic and atraumatic.  Eyes:     Conjunctiva/sclera: Conjunctivae normal.  Neck:     Musculoskeletal: Neck supple.  Cardiovascular:     Rate and Rhythm: Normal rate and regular rhythm.     Heart sounds: Normal heart sounds. No murmur.  Pulmonary:     Effort: Pulmonary effort is normal. No respiratory  distress.     Breath sounds: Normal breath sounds.  Abdominal:     Palpations: Abdomen is soft.     Tenderness: There is no abdominal tenderness.  Skin:    General: Skin is warm and dry.     Capillary Refill: Capillary refill takes less than 2 seconds.  Neurological:     Mental Status: She is alert.      ED Treatments / Results  Labs (all labs ordered are listed, but only abnormal results are displayed) Labs Reviewed  URINALYSIS, ROUTINE W REFLEX MICROSCOPIC - Abnormal; Notable for the following components:      Result Value   APPearance CLOUDY (*)    Ketones, ur 15 (*)    Nitrite POSITIVE (*)    Leukocytes,  UA SMALL (*)    All other components within normal limits  CBC WITH DIFFERENTIAL/PLATELET - Abnormal; Notable for the following components:   Abs Immature Granulocytes 0.12 (*)    All other components within normal limits  COMPREHENSIVE METABOLIC PANEL - Abnormal; Notable for the following components:   Glucose, Bld 127 (*)    BUN 26 (*)    Creatinine, Ser 1.35 (*)    Albumin 3.3 (*)    GFR calc non Af Amer 34 (*)    GFR calc Af Amer 39 (*)    All other components within normal limits  URINALYSIS, MICROSCOPIC (REFLEX) - Abnormal; Notable for the following components:   Bacteria, UA MANY (*)    All other components within normal limits  URINE CULTURE  LIPASE, BLOOD    EKG None  Radiology Ct Abdomen Pelvis Wo Contrast  Result Date: 03/05/2018 CLINICAL DATA:  Abdominal pain.  Diverticulitis suspected. EXAM: CT ABDOMEN AND PELVIS WITHOUT CONTRAST TECHNIQUE: Multidetector CT imaging of the abdomen and pelvis was performed following the standard protocol without IV contrast. COMPARISON:  None. FINDINGS: Lower chest: No acute abnormality. Hepatobiliary: No focal liver abnormality is seen. No gallstones, gallbladder wall thickening, or biliary dilatation. Pancreas: Unremarkable. No pancreatic ductal dilatation or surrounding inflammatory changes. Spleen: Normal in size  without focal abnormality. Adrenals/Urinary Tract: Probable cyst with peripheral calcification in the upper left kidney. No other renal masses noted. No renal stones, hydronephrosis, or perinephric stranding. No ureterectasis or ureteral stones. Calcification in the right lateral bladder likely tiny stones or debris. The bladder is otherwise normal. Stomach/Bowel: The distal esophagus and stomach are normal. The small bowel is unremarkable. Moderate to severe fecal loading seen in the colon with a moderate stool ball in the rectum measuring 10 by 6.4 by 5.3 cm. No perirectal fat stranding. No pericolonic stranding. There are a few diverticuli in the right colon without evidence of diverticulitis. The appendix is normal. Vascular/Lymphatic: Atherosclerotic changes are seen in the nonaneurysmal aorta. No adenopathy. Reproductive: Fibroid in the uterus.  No adnexal mass. Other: No abdominal wall hernia or abnormality. No abdominopelvic ascites. Musculoskeletal: Degenerative changes identified in the spine. Scoliotic curvature. Grade 1-2 anterolisthesis of L4 versus L5. IMPRESSION: 1. Diverticulosis in the right colon without diverticulitis. The appendix is normal. 2. Moderate fecal loading throughout the colon with a moderate-sized stool ball in the rectum. 3. Probable peripherally calcified cyst in the upper left kidney. 4. No renal stones or obstruction. 5. Probable debris or tiny stones in the bladder. 6. Atherosclerotic change in the nonaneurysmal aorta. 7. Fibroid in the uterus. 8. Significant degenerative changes in the lumbar spine. Electronically Signed   By: Gerome Samavid  Williams III M.D   On: 03/05/2018 17:02   Dg Chest 2 View  Result Date: 03/05/2018 CLINICAL DATA:  Recent history of hypoxia. EXAM: CHEST - 2 VIEW COMPARISON:  12/07/2017 FINDINGS: Elevation of the right hemidiaphragm. Lungs are clear. Heart size is within normal limits and stable. Atherosclerotic calcifications at the aortic arch. Degenerative  changes in both shoulders. Negative for pneumothorax. No pleural effusions. IMPRESSION: No active cardiopulmonary disease. Electronically Signed   By: Richarda OverlieAdam  Henn M.D.   On: 03/05/2018 16:12    Procedures Procedures (including critical care time)  Medications Ordered in ED Medications  lactated ringers bolus 1,000 mL (0 mLs Intravenous Stopped 03/05/18 2043)     Initial Impression / Assessment and Plan / ED Course  I have reviewed the triage vital signs and the nursing notes.  Pertinent labs &  imaging results that were available during my care of the patient were reviewed by me and considered in my medical decision making (see chart for details).     Patient is a 84 year old female who presents above stated history and exam for evaluation of an episode of abdominal pain that occurred earlier today since resolved prior to presentation. Patient is accompanied by her son who states he sees the patient daily and that she has complained often of intermittent abdominal pain over the last several months has and she has been diagnosed with constipation and current bowel regimen. Exam as above remarkable for abdomen is soft and nontender. There is no CVA tenderness. Exam otherwise unremarkable.  Doubt ACS as the patient denies any chest pain or shortness of breath andEKG remarkable for sinus rhythm with no signs of acute ischemic change.  Doubt acute peritonitis, lipase of 28.CMP remarkable for no significant electrolyte abnormalities, creatinine of 1.35 which is mildly elevated from prior 2 months ago of 0.77, with unremarkable LFTs.CBC WNL.UA is concerning for urinary tract infection given positive nitrites and small LES. Given patient's age and history a CT of the patient's abdomen and pelvis was obtained. While the CT showed several chronic findings including diverticulitis no acute findings were seen. Impression is UTI with possible Ecomponent of constipation contributing to the patient's abdominal  discomfort.her history and exam is otherwise not consistent with perforated viscus, acute ischemic enteritis, autoimmune enteritis, or other imminent life-threatening etiology.  Patient discharged in stable condition. Prescription provided for antibiotics for UTI.  Final Clinical Impressions(s) / ED Diagnoses   Final diagnoses:  Acute cystitis without hematuria    ED Discharge Orders         Ordered    cephALEXin (KEFLEX) 500 MG capsule  4 times daily     03/05/18 2112           Antoine PrimasSmith, Zachary, MD 03/05/18 2225    Maia PlanLong, Joshua G, MD 03/06/18 [id number]

## 2018-03-05 NOTE — ED Notes (Signed)
PT family states understanding of care given, follow up care, and medication prescribed. PT taken to [facility] by [facility] at this time.

## 2018-03-08 LAB — URINE CULTURE: Culture: >=100000 — AB

## 2018-03-09 ENCOUNTER — Telehealth: Payer: Self-pay | Admitting: Emergency Medicine

## 2018-03-09 NOTE — Telephone Encounter (Signed)
Post ED Visit - Positive Culture Follow-up  Culture report reviewed by antimicrobial stewardship pharmacist:  []  Enzo BiNathan Batchelder, Pharm.D. []  Celedonio MiyamotoJeremy Frens, Pharm.D., BCPS AQ-ID []  Garvin FilaMike Maccia, Pharm.D., BCPS []  Georgina PillionElizabeth Martin, [address]Pharm[address]D., BCPS []  AdelineMinh Pham, Vermont[place]arm[place]D., BCPS, AAHIVP []  Estella HuskMichelle Turner, Pharm.D., BCPS, AAHIVP [x]  Lysle Pearlachel Rumbarger, PharmD, BCPS []  Phillips Climeshuy Dang, PharmD, BCPS []  Agapito GamesAlison Masters, PharmD, BCPS []  Verlan FriendsErin Deja, PharmD  Positive urine culture Treated with Cephalexin, organism sensitive to the same and no further patient follow-up is required at this time.  Carollee HerterShannon Etana Beets 03/09/2018, 9:39 AM

## 2018-03-24 DIAGNOSIS — S31829A Unspecified open wound of left buttock, initial encounter: Secondary | ICD-10-CM | POA: Diagnosis not present

## 2018-03-24 DIAGNOSIS — L89152 Pressure ulcer of sacral region, stage 2: Secondary | ICD-10-CM | POA: Diagnosis not present

## 2018-03-25 DIAGNOSIS — F0391 Unspecified dementia with behavioral disturbance: Secondary | ICD-10-CM | POA: Diagnosis not present

## 2018-03-25 DIAGNOSIS — F411 Generalized anxiety disorder: Secondary | ICD-10-CM | POA: Diagnosis not present

## 2018-03-25 DIAGNOSIS — G309 Alzheimer's disease, unspecified: Secondary | ICD-10-CM | POA: Diagnosis not present

## 2018-03-31 DIAGNOSIS — L89152 Pressure ulcer of sacral region, stage 2: Secondary | ICD-10-CM | POA: Diagnosis not present

## 2018-03-31 DIAGNOSIS — S31829A Unspecified open wound of left buttock, initial encounter: Secondary | ICD-10-CM | POA: Diagnosis not present

## 2018-04-07 DIAGNOSIS — F0391 Unspecified dementia with behavioral disturbance: Secondary | ICD-10-CM | POA: Diagnosis not present

## 2018-04-07 DIAGNOSIS — D649 Anemia, unspecified: Secondary | ICD-10-CM | POA: Diagnosis not present

## 2018-04-07 DIAGNOSIS — L89152 Pressure ulcer of sacral region, stage 2: Secondary | ICD-10-CM | POA: Diagnosis not present

## 2018-04-07 DIAGNOSIS — F0390 Unspecified dementia without behavioral disturbance: Secondary | ICD-10-CM | POA: Diagnosis not present

## 2018-04-07 DIAGNOSIS — E039 Hypothyroidism, unspecified: Secondary | ICD-10-CM | POA: Diagnosis not present

## 2018-04-07 DIAGNOSIS — Z79899 Other long term (current) drug therapy: Secondary | ICD-10-CM | POA: Diagnosis not present

## 2018-04-14 DIAGNOSIS — L89152 Pressure ulcer of sacral region, stage 2: Secondary | ICD-10-CM | POA: Diagnosis not present

## 2018-04-18 DIAGNOSIS — R319 Hematuria, unspecified: Secondary | ICD-10-CM | POA: Diagnosis not present

## 2018-04-18 DIAGNOSIS — N390 Urinary tract infection, site not specified: Secondary | ICD-10-CM | POA: Diagnosis not present

## 2018-04-21 DIAGNOSIS — L89152 Pressure ulcer of sacral region, stage 2: Secondary | ICD-10-CM | POA: Diagnosis not present

## 2018-04-28 DIAGNOSIS — L89152 Pressure ulcer of sacral region, stage 2: Secondary | ICD-10-CM | POA: Diagnosis not present

## 2018-05-05 DIAGNOSIS — L89152 Pressure ulcer of sacral region, stage 2: Secondary | ICD-10-CM | POA: Diagnosis not present

## 2018-05-06 DIAGNOSIS — G309 Alzheimer's disease, unspecified: Secondary | ICD-10-CM | POA: Diagnosis not present

## 2018-05-06 DIAGNOSIS — F0391 Unspecified dementia with behavioral disturbance: Secondary | ICD-10-CM | POA: Diagnosis not present

## 2018-05-06 DIAGNOSIS — F411 Generalized anxiety disorder: Secondary | ICD-10-CM | POA: Diagnosis not present

## 2018-05-07 DIAGNOSIS — F0390 Unspecified dementia without behavioral disturbance: Secondary | ICD-10-CM | POA: Diagnosis not present

## 2018-05-07 DIAGNOSIS — Z79899 Other long term (current) drug therapy: Secondary | ICD-10-CM | POA: Diagnosis not present

## 2018-05-07 DIAGNOSIS — R569 Unspecified convulsions: Secondary | ICD-10-CM | POA: Diagnosis not present

## 2018-05-07 DIAGNOSIS — F0391 Unspecified dementia with behavioral disturbance: Secondary | ICD-10-CM | POA: Diagnosis not present

## 2018-05-12 DIAGNOSIS — L89152 Pressure ulcer of sacral region, stage 2: Secondary | ICD-10-CM | POA: Diagnosis not present

## 2018-05-19 DIAGNOSIS — L89152 Pressure ulcer of sacral region, stage 2: Secondary | ICD-10-CM | POA: Diagnosis not present

## 2018-05-20 DIAGNOSIS — F411 Generalized anxiety disorder: Secondary | ICD-10-CM | POA: Diagnosis not present

## 2018-05-20 DIAGNOSIS — F0391 Unspecified dementia with behavioral disturbance: Secondary | ICD-10-CM | POA: Diagnosis not present

## 2018-05-20 DIAGNOSIS — G309 Alzheimer's disease, unspecified: Secondary | ICD-10-CM | POA: Diagnosis not present

## 2018-05-21 DIAGNOSIS — R293 Abnormal posture: Secondary | ICD-10-CM | POA: Diagnosis not present

## 2018-05-21 DIAGNOSIS — M6281 Muscle weakness (generalized): Secondary | ICD-10-CM | POA: Diagnosis not present

## 2018-05-21 DIAGNOSIS — F0390 Unspecified dementia without behavioral disturbance: Secondary | ICD-10-CM | POA: Diagnosis not present

## 2018-05-21 DIAGNOSIS — R2681 Unsteadiness on feet: Secondary | ICD-10-CM | POA: Diagnosis not present

## 2018-05-21 DIAGNOSIS — R278 Other lack of coordination: Secondary | ICD-10-CM | POA: Diagnosis not present

## 2018-05-21 DIAGNOSIS — R1311 Dysphagia, oral phase: Secondary | ICD-10-CM | POA: Diagnosis not present

## 2018-05-21 DIAGNOSIS — L89153 Pressure ulcer of sacral region, stage 3: Secondary | ICD-10-CM | POA: Diagnosis not present

## 2018-05-21 DIAGNOSIS — H548 Legal blindness, as defined in USA: Secondary | ICD-10-CM | POA: Diagnosis not present

## 2018-05-26 DIAGNOSIS — R1311 Dysphagia, oral phase: Secondary | ICD-10-CM | POA: Diagnosis not present

## 2018-05-26 DIAGNOSIS — L89153 Pressure ulcer of sacral region, stage 3: Secondary | ICD-10-CM | POA: Diagnosis not present

## 2018-05-26 DIAGNOSIS — R293 Abnormal posture: Secondary | ICD-10-CM | POA: Diagnosis not present

## 2018-05-26 DIAGNOSIS — R278 Other lack of coordination: Secondary | ICD-10-CM | POA: Diagnosis not present

## 2018-05-26 DIAGNOSIS — M6281 Muscle weakness (generalized): Secondary | ICD-10-CM | POA: Diagnosis not present

## 2018-05-26 DIAGNOSIS — L89152 Pressure ulcer of sacral region, stage 2: Secondary | ICD-10-CM | POA: Diagnosis not present

## 2018-05-26 DIAGNOSIS — R2681 Unsteadiness on feet: Secondary | ICD-10-CM | POA: Diagnosis not present

## 2018-05-27 DIAGNOSIS — R1311 Dysphagia, oral phase: Secondary | ICD-10-CM | POA: Diagnosis not present

## 2018-05-27 DIAGNOSIS — L89153 Pressure ulcer of sacral region, stage 3: Secondary | ICD-10-CM | POA: Diagnosis not present

## 2018-05-27 DIAGNOSIS — R278 Other lack of coordination: Secondary | ICD-10-CM | POA: Diagnosis not present

## 2018-05-27 DIAGNOSIS — R2681 Unsteadiness on feet: Secondary | ICD-10-CM | POA: Diagnosis not present

## 2018-05-27 DIAGNOSIS — R293 Abnormal posture: Secondary | ICD-10-CM | POA: Diagnosis not present

## 2018-05-27 DIAGNOSIS — M6281 Muscle weakness (generalized): Secondary | ICD-10-CM | POA: Diagnosis not present

## 2018-05-28 DIAGNOSIS — R2681 Unsteadiness on feet: Secondary | ICD-10-CM | POA: Diagnosis not present

## 2018-05-28 DIAGNOSIS — M6281 Muscle weakness (generalized): Secondary | ICD-10-CM | POA: Diagnosis not present

## 2018-05-28 DIAGNOSIS — L89153 Pressure ulcer of sacral region, stage 3: Secondary | ICD-10-CM | POA: Diagnosis not present

## 2018-05-28 DIAGNOSIS — R1311 Dysphagia, oral phase: Secondary | ICD-10-CM | POA: Diagnosis not present

## 2018-05-28 DIAGNOSIS — R278 Other lack of coordination: Secondary | ICD-10-CM | POA: Diagnosis not present

## 2018-05-28 DIAGNOSIS — R293 Abnormal posture: Secondary | ICD-10-CM | POA: Diagnosis not present

## 2018-05-29 DIAGNOSIS — R278 Other lack of coordination: Secondary | ICD-10-CM | POA: Diagnosis not present

## 2018-05-29 DIAGNOSIS — R293 Abnormal posture: Secondary | ICD-10-CM | POA: Diagnosis not present

## 2018-05-29 DIAGNOSIS — R2681 Unsteadiness on feet: Secondary | ICD-10-CM | POA: Diagnosis not present

## 2018-05-29 DIAGNOSIS — L89153 Pressure ulcer of sacral region, stage 3: Secondary | ICD-10-CM | POA: Diagnosis not present

## 2018-05-29 DIAGNOSIS — M6281 Muscle weakness (generalized): Secondary | ICD-10-CM | POA: Diagnosis not present

## 2018-05-29 DIAGNOSIS — R1311 Dysphagia, oral phase: Secondary | ICD-10-CM | POA: Diagnosis not present

## 2018-06-01 DIAGNOSIS — L89153 Pressure ulcer of sacral region, stage 3: Secondary | ICD-10-CM | POA: Diagnosis not present

## 2018-06-01 DIAGNOSIS — R2681 Unsteadiness on feet: Secondary | ICD-10-CM | POA: Diagnosis not present

## 2018-06-01 DIAGNOSIS — M6281 Muscle weakness (generalized): Secondary | ICD-10-CM | POA: Diagnosis not present

## 2018-06-01 DIAGNOSIS — R1311 Dysphagia, oral phase: Secondary | ICD-10-CM | POA: Diagnosis not present

## 2018-06-01 DIAGNOSIS — R278 Other lack of coordination: Secondary | ICD-10-CM | POA: Diagnosis not present

## 2018-06-01 DIAGNOSIS — R293 Abnormal posture: Secondary | ICD-10-CM | POA: Diagnosis not present

## 2018-06-02 DIAGNOSIS — L89322 Pressure ulcer of left buttock, stage 2: Secondary | ICD-10-CM | POA: Diagnosis not present

## 2018-06-02 DIAGNOSIS — L89153 Pressure ulcer of sacral region, stage 3: Secondary | ICD-10-CM | POA: Diagnosis not present

## 2018-06-02 DIAGNOSIS — R293 Abnormal posture: Secondary | ICD-10-CM | POA: Diagnosis not present

## 2018-06-02 DIAGNOSIS — R278 Other lack of coordination: Secondary | ICD-10-CM | POA: Diagnosis not present

## 2018-06-02 DIAGNOSIS — M6281 Muscle weakness (generalized): Secondary | ICD-10-CM | POA: Diagnosis not present

## 2018-06-02 DIAGNOSIS — L89152 Pressure ulcer of sacral region, stage 2: Secondary | ICD-10-CM | POA: Diagnosis not present

## 2018-06-02 DIAGNOSIS — R1311 Dysphagia, oral phase: Secondary | ICD-10-CM | POA: Diagnosis not present

## 2018-06-02 DIAGNOSIS — R2681 Unsteadiness on feet: Secondary | ICD-10-CM | POA: Diagnosis not present

## 2018-06-03 DIAGNOSIS — R1311 Dysphagia, oral phase: Secondary | ICD-10-CM | POA: Diagnosis not present

## 2018-06-03 DIAGNOSIS — G309 Alzheimer's disease, unspecified: Secondary | ICD-10-CM | POA: Diagnosis not present

## 2018-06-03 DIAGNOSIS — M6281 Muscle weakness (generalized): Secondary | ICD-10-CM | POA: Diagnosis not present

## 2018-06-03 DIAGNOSIS — R278 Other lack of coordination: Secondary | ICD-10-CM | POA: Diagnosis not present

## 2018-06-03 DIAGNOSIS — R293 Abnormal posture: Secondary | ICD-10-CM | POA: Diagnosis not present

## 2018-06-03 DIAGNOSIS — F411 Generalized anxiety disorder: Secondary | ICD-10-CM | POA: Diagnosis not present

## 2018-06-03 DIAGNOSIS — F0391 Unspecified dementia with behavioral disturbance: Secondary | ICD-10-CM | POA: Diagnosis not present

## 2018-06-03 DIAGNOSIS — R2681 Unsteadiness on feet: Secondary | ICD-10-CM | POA: Diagnosis not present

## 2018-06-03 DIAGNOSIS — L89153 Pressure ulcer of sacral region, stage 3: Secondary | ICD-10-CM | POA: Diagnosis not present

## 2018-06-04 DIAGNOSIS — N390 Urinary tract infection, site not specified: Secondary | ICD-10-CM | POA: Diagnosis not present

## 2018-06-04 DIAGNOSIS — R278 Other lack of coordination: Secondary | ICD-10-CM | POA: Diagnosis not present

## 2018-06-04 DIAGNOSIS — F0390 Unspecified dementia without behavioral disturbance: Secondary | ICD-10-CM | POA: Diagnosis not present

## 2018-06-04 DIAGNOSIS — R1311 Dysphagia, oral phase: Secondary | ICD-10-CM | POA: Diagnosis not present

## 2018-06-04 DIAGNOSIS — Z79899 Other long term (current) drug therapy: Secondary | ICD-10-CM | POA: Diagnosis not present

## 2018-06-04 DIAGNOSIS — R293 Abnormal posture: Secondary | ICD-10-CM | POA: Diagnosis not present

## 2018-06-04 DIAGNOSIS — R569 Unspecified convulsions: Secondary | ICD-10-CM | POA: Diagnosis not present

## 2018-06-04 DIAGNOSIS — R2681 Unsteadiness on feet: Secondary | ICD-10-CM | POA: Diagnosis not present

## 2018-06-04 DIAGNOSIS — M6281 Muscle weakness (generalized): Secondary | ICD-10-CM | POA: Diagnosis not present

## 2018-06-04 DIAGNOSIS — L89153 Pressure ulcer of sacral region, stage 3: Secondary | ICD-10-CM | POA: Diagnosis not present

## 2018-06-04 DIAGNOSIS — F0391 Unspecified dementia with behavioral disturbance: Secondary | ICD-10-CM | POA: Diagnosis not present

## 2018-06-08 DIAGNOSIS — R2681 Unsteadiness on feet: Secondary | ICD-10-CM | POA: Diagnosis not present

## 2018-06-08 DIAGNOSIS — M6281 Muscle weakness (generalized): Secondary | ICD-10-CM | POA: Diagnosis not present

## 2018-06-08 DIAGNOSIS — R278 Other lack of coordination: Secondary | ICD-10-CM | POA: Diagnosis not present

## 2018-06-08 DIAGNOSIS — R293 Abnormal posture: Secondary | ICD-10-CM | POA: Diagnosis not present

## 2018-06-08 DIAGNOSIS — L89153 Pressure ulcer of sacral region, stage 3: Secondary | ICD-10-CM | POA: Diagnosis not present

## 2018-06-08 DIAGNOSIS — R1311 Dysphagia, oral phase: Secondary | ICD-10-CM | POA: Diagnosis not present

## 2018-06-09 DIAGNOSIS — R278 Other lack of coordination: Secondary | ICD-10-CM | POA: Diagnosis not present

## 2018-06-09 DIAGNOSIS — R1311 Dysphagia, oral phase: Secondary | ICD-10-CM | POA: Diagnosis not present

## 2018-06-09 DIAGNOSIS — L89152 Pressure ulcer of sacral region, stage 2: Secondary | ICD-10-CM | POA: Diagnosis not present

## 2018-06-09 DIAGNOSIS — L89153 Pressure ulcer of sacral region, stage 3: Secondary | ICD-10-CM | POA: Diagnosis not present

## 2018-06-09 DIAGNOSIS — M6281 Muscle weakness (generalized): Secondary | ICD-10-CM | POA: Diagnosis not present

## 2018-06-09 DIAGNOSIS — R293 Abnormal posture: Secondary | ICD-10-CM | POA: Diagnosis not present

## 2018-06-09 DIAGNOSIS — R2681 Unsteadiness on feet: Secondary | ICD-10-CM | POA: Diagnosis not present

## 2018-06-09 DIAGNOSIS — L89322 Pressure ulcer of left buttock, stage 2: Secondary | ICD-10-CM | POA: Diagnosis not present

## 2018-06-16 DIAGNOSIS — L89322 Pressure ulcer of left buttock, stage 2: Secondary | ICD-10-CM | POA: Diagnosis not present

## 2018-06-16 DIAGNOSIS — L89152 Pressure ulcer of sacral region, stage 2: Secondary | ICD-10-CM | POA: Diagnosis not present

## 2018-06-17 DIAGNOSIS — F411 Generalized anxiety disorder: Secondary | ICD-10-CM | POA: Diagnosis not present

## 2018-06-17 DIAGNOSIS — G309 Alzheimer's disease, unspecified: Secondary | ICD-10-CM | POA: Diagnosis not present

## 2018-06-17 DIAGNOSIS — F0391 Unspecified dementia with behavioral disturbance: Secondary | ICD-10-CM | POA: Diagnosis not present

## 2018-07-01 DIAGNOSIS — G309 Alzheimer's disease, unspecified: Secondary | ICD-10-CM | POA: Diagnosis not present

## 2018-07-01 DIAGNOSIS — F411 Generalized anxiety disorder: Secondary | ICD-10-CM | POA: Diagnosis not present

## 2018-07-01 DIAGNOSIS — F0391 Unspecified dementia with behavioral disturbance: Secondary | ICD-10-CM | POA: Diagnosis not present

## 2018-07-05 DIAGNOSIS — Z03818 Encounter for observation for suspected exposure to other biological agents ruled out: Secondary | ICD-10-CM | POA: Diagnosis not present

## 2018-07-22 DIAGNOSIS — Z20828 Contact with and (suspected) exposure to other viral communicable diseases: Secondary | ICD-10-CM | POA: Diagnosis not present

## 2018-07-27 DIAGNOSIS — Z20828 Contact with and (suspected) exposure to other viral communicable diseases: Secondary | ICD-10-CM | POA: Diagnosis not present

## 2018-07-31 DIAGNOSIS — Z20828 Contact with and (suspected) exposure to other viral communicable diseases: Secondary | ICD-10-CM | POA: Diagnosis not present

## 2018-08-02 DIAGNOSIS — E038 Other specified hypothyroidism: Secondary | ICD-10-CM | POA: Diagnosis not present

## 2018-08-02 DIAGNOSIS — G301 Alzheimer's disease with late onset: Secondary | ICD-10-CM | POA: Diagnosis not present

## 2018-08-02 DIAGNOSIS — U071 COVID-19: Secondary | ICD-10-CM | POA: Diagnosis not present

## 2018-08-05 DIAGNOSIS — Z79899 Other long term (current) drug therapy: Secondary | ICD-10-CM | POA: Diagnosis not present

## 2018-08-05 DIAGNOSIS — D649 Anemia, unspecified: Secondary | ICD-10-CM | POA: Diagnosis not present

## 2018-08-18 DIAGNOSIS — Z79899 Other long term (current) drug therapy: Secondary | ICD-10-CM | POA: Diagnosis not present

## 2018-08-18 DIAGNOSIS — F0390 Unspecified dementia without behavioral disturbance: Secondary | ICD-10-CM | POA: Diagnosis not present

## 2018-08-18 DIAGNOSIS — F0391 Unspecified dementia with behavioral disturbance: Secondary | ICD-10-CM | POA: Diagnosis not present

## 2018-08-18 DIAGNOSIS — N390 Urinary tract infection, site not specified: Secondary | ICD-10-CM | POA: Diagnosis not present

## 2018-08-22 DIAGNOSIS — G309 Alzheimer's disease, unspecified: Secondary | ICD-10-CM | POA: Diagnosis not present

## 2018-08-22 DIAGNOSIS — E46 Unspecified protein-calorie malnutrition: Secondary | ICD-10-CM | POA: Diagnosis not present

## 2018-08-22 DIAGNOSIS — U071 COVID-19: Secondary | ICD-10-CM | POA: Diagnosis not present

## 2018-08-31 DIAGNOSIS — Z20828 Contact with and (suspected) exposure to other viral communicable diseases: Secondary | ICD-10-CM | POA: Diagnosis not present

## 2018-09-20 DIAGNOSIS — R400 Somnolence: Secondary | ICD-10-CM | POA: Diagnosis not present

## 2018-09-20 DIAGNOSIS — G309 Alzheimer's disease, unspecified: Secondary | ICD-10-CM | POA: Diagnosis not present

## 2018-09-20 DIAGNOSIS — R4586 Emotional lability: Secondary | ICD-10-CM | POA: Diagnosis not present

## 2018-09-20 DIAGNOSIS — E46 Unspecified protein-calorie malnutrition: Secondary | ICD-10-CM | POA: Diagnosis not present

## 2018-10-09 DIAGNOSIS — I739 Peripheral vascular disease, unspecified: Secondary | ICD-10-CM | POA: Diagnosis not present

## 2018-10-09 DIAGNOSIS — B351 Tinea unguium: Secondary | ICD-10-CM | POA: Diagnosis not present

## 2018-10-21 DIAGNOSIS — F411 Generalized anxiety disorder: Secondary | ICD-10-CM | POA: Diagnosis not present

## 2018-10-21 DIAGNOSIS — G309 Alzheimer's disease, unspecified: Secondary | ICD-10-CM | POA: Diagnosis not present

## 2018-10-21 DIAGNOSIS — F0391 Unspecified dementia with behavioral disturbance: Secondary | ICD-10-CM | POA: Diagnosis not present

## 2018-10-29 DIAGNOSIS — Z961 Presence of intraocular lens: Secondary | ICD-10-CM | POA: Diagnosis not present

## 2018-10-29 DIAGNOSIS — H2511 Age-related nuclear cataract, right eye: Secondary | ICD-10-CM | POA: Diagnosis not present

## 2018-10-29 DIAGNOSIS — F0390 Unspecified dementia without behavioral disturbance: Secondary | ICD-10-CM | POA: Diagnosis not present

## 2018-10-29 DIAGNOSIS — H401134 Primary open-angle glaucoma, bilateral, indeterminate stage: Secondary | ICD-10-CM | POA: Diagnosis not present

## 2018-11-18 DIAGNOSIS — Z20828 Contact with and (suspected) exposure to other viral communicable diseases: Secondary | ICD-10-CM | POA: Diagnosis not present

## 2018-11-25 DIAGNOSIS — Z20828 Contact with and (suspected) exposure to other viral communicable diseases: Secondary | ICD-10-CM | POA: Diagnosis not present

## 2018-12-12 DIAGNOSIS — R569 Unspecified convulsions: Secondary | ICD-10-CM | POA: Diagnosis not present

## 2018-12-12 DIAGNOSIS — Z79899 Other long term (current) drug therapy: Secondary | ICD-10-CM | POA: Diagnosis not present

## 2018-12-17 DIAGNOSIS — B351 Tinea unguium: Secondary | ICD-10-CM | POA: Diagnosis not present

## 2018-12-17 DIAGNOSIS — L603 Nail dystrophy: Secondary | ICD-10-CM | POA: Diagnosis not present

## 2018-12-17 DIAGNOSIS — I739 Peripheral vascular disease, unspecified: Secondary | ICD-10-CM | POA: Diagnosis not present

## 2019-01-13 DIAGNOSIS — F0391 Unspecified dementia with behavioral disturbance: Secondary | ICD-10-CM | POA: Diagnosis not present

## 2019-01-13 DIAGNOSIS — F411 Generalized anxiety disorder: Secondary | ICD-10-CM | POA: Diagnosis not present

## 2019-01-13 DIAGNOSIS — G309 Alzheimer's disease, unspecified: Secondary | ICD-10-CM | POA: Diagnosis not present

## 2019-01-25 DIAGNOSIS — F0391 Unspecified dementia with behavioral disturbance: Secondary | ICD-10-CM | POA: Diagnosis not present

## 2019-01-25 DIAGNOSIS — R569 Unspecified convulsions: Secondary | ICD-10-CM | POA: Diagnosis not present

## 2019-01-25 DIAGNOSIS — E785 Hyperlipidemia, unspecified: Secondary | ICD-10-CM | POA: Diagnosis not present

## 2019-01-25 DIAGNOSIS — Z79899 Other long term (current) drug therapy: Secondary | ICD-10-CM | POA: Diagnosis not present

## 2019-01-25 DIAGNOSIS — F0390 Unspecified dementia without behavioral disturbance: Secondary | ICD-10-CM | POA: Diagnosis not present

## 2019-01-25 DIAGNOSIS — E039 Hypothyroidism, unspecified: Secondary | ICD-10-CM | POA: Diagnosis not present

## 2019-01-25 DIAGNOSIS — D649 Anemia, unspecified: Secondary | ICD-10-CM | POA: Diagnosis not present

## 2019-01-25 DIAGNOSIS — N390 Urinary tract infection, site not specified: Secondary | ICD-10-CM | POA: Diagnosis not present

## 2019-02-03 DIAGNOSIS — G309 Alzheimer's disease, unspecified: Secondary | ICD-10-CM | POA: Diagnosis not present

## 2019-02-03 DIAGNOSIS — R1311 Dysphagia, oral phase: Secondary | ICD-10-CM | POA: Diagnosis not present

## 2019-02-03 DIAGNOSIS — F0281 Dementia in other diseases classified elsewhere with behavioral disturbance: Secondary | ICD-10-CM | POA: Diagnosis not present

## 2019-02-04 DIAGNOSIS — G309 Alzheimer's disease, unspecified: Secondary | ICD-10-CM | POA: Diagnosis not present

## 2019-02-04 DIAGNOSIS — F0281 Dementia in other diseases classified elsewhere with behavioral disturbance: Secondary | ICD-10-CM | POA: Diagnosis not present

## 2019-02-04 DIAGNOSIS — R1311 Dysphagia, oral phase: Secondary | ICD-10-CM | POA: Diagnosis not present

## 2019-02-05 DIAGNOSIS — R1311 Dysphagia, oral phase: Secondary | ICD-10-CM | POA: Diagnosis not present

## 2019-02-05 DIAGNOSIS — F0281 Dementia in other diseases classified elsewhere with behavioral disturbance: Secondary | ICD-10-CM | POA: Diagnosis not present

## 2019-02-05 DIAGNOSIS — G309 Alzheimer's disease, unspecified: Secondary | ICD-10-CM | POA: Diagnosis not present

## 2019-02-08 DIAGNOSIS — R1311 Dysphagia, oral phase: Secondary | ICD-10-CM | POA: Diagnosis not present

## 2019-02-08 DIAGNOSIS — F0281 Dementia in other diseases classified elsewhere with behavioral disturbance: Secondary | ICD-10-CM | POA: Diagnosis not present

## 2019-02-08 DIAGNOSIS — G309 Alzheimer's disease, unspecified: Secondary | ICD-10-CM | POA: Diagnosis not present

## 2019-02-09 DIAGNOSIS — G309 Alzheimer's disease, unspecified: Secondary | ICD-10-CM | POA: Diagnosis not present

## 2019-02-09 DIAGNOSIS — F0281 Dementia in other diseases classified elsewhere with behavioral disturbance: Secondary | ICD-10-CM | POA: Diagnosis not present

## 2019-02-09 DIAGNOSIS — R1311 Dysphagia, oral phase: Secondary | ICD-10-CM | POA: Diagnosis not present

## 2019-02-10 DIAGNOSIS — F0281 Dementia in other diseases classified elsewhere with behavioral disturbance: Secondary | ICD-10-CM | POA: Diagnosis not present

## 2019-02-10 DIAGNOSIS — R1311 Dysphagia, oral phase: Secondary | ICD-10-CM | POA: Diagnosis not present

## 2019-02-10 DIAGNOSIS — G309 Alzheimer's disease, unspecified: Secondary | ICD-10-CM | POA: Diagnosis not present

## 2019-02-14 DIAGNOSIS — Z20828 Contact with and (suspected) exposure to other viral communicable diseases: Secondary | ICD-10-CM | POA: Diagnosis not present

## 2019-02-15 DIAGNOSIS — R1311 Dysphagia, oral phase: Secondary | ICD-10-CM | POA: Diagnosis not present

## 2019-02-15 DIAGNOSIS — G309 Alzheimer's disease, unspecified: Secondary | ICD-10-CM | POA: Diagnosis not present

## 2019-02-15 DIAGNOSIS — F0281 Dementia in other diseases classified elsewhere with behavioral disturbance: Secondary | ICD-10-CM | POA: Diagnosis not present

## 2019-02-18 DIAGNOSIS — D649 Anemia, unspecified: Secondary | ICD-10-CM | POA: Diagnosis not present

## 2019-02-18 DIAGNOSIS — R5383 Other fatigue: Secondary | ICD-10-CM | POA: Diagnosis not present

## 2019-02-19 DIAGNOSIS — R5383 Other fatigue: Secondary | ICD-10-CM | POA: Diagnosis not present

## 2019-02-19 DIAGNOSIS — N390 Urinary tract infection, site not specified: Secondary | ICD-10-CM | POA: Diagnosis not present

## 2019-02-19 DIAGNOSIS — F0390 Unspecified dementia without behavioral disturbance: Secondary | ICD-10-CM | POA: Diagnosis not present

## 2019-02-19 DIAGNOSIS — F0391 Unspecified dementia with behavioral disturbance: Secondary | ICD-10-CM | POA: Diagnosis not present

## 2019-02-22 DIAGNOSIS — Z20828 Contact with and (suspected) exposure to other viral communicable diseases: Secondary | ICD-10-CM | POA: Diagnosis not present

## 2019-02-23 DIAGNOSIS — D649 Anemia, unspecified: Secondary | ICD-10-CM | POA: Diagnosis not present

## 2019-02-23 DIAGNOSIS — E875 Hyperkalemia: Secondary | ICD-10-CM | POA: Diagnosis not present

## 2019-04-19 DEATH — deceased

## 2019-07-26 IMAGING — CT CT ABD-PELV W/O CM
2 of 4 series · 15 of 46 positions shown, 17 images · non-contrast
Comparison: None.

CLINICAL DATA: Abdominal pain.  Diverticulitis suspected.

EXAM:
CT ABDOMEN AND PELVIS WITHOUT CONTRAST
TECHNIQUE: Multidetector CT imaging of the abdomen and pelvis was performed
following the standard protocol without IV contrast.

[Series 3: a/p w/o 5mm · axial · non-contrast · 0.76mm/px · z∈[+897,+1247]mm · 12 of 84 slices shown, 14 images]
[im 7/84  soft-tissue]
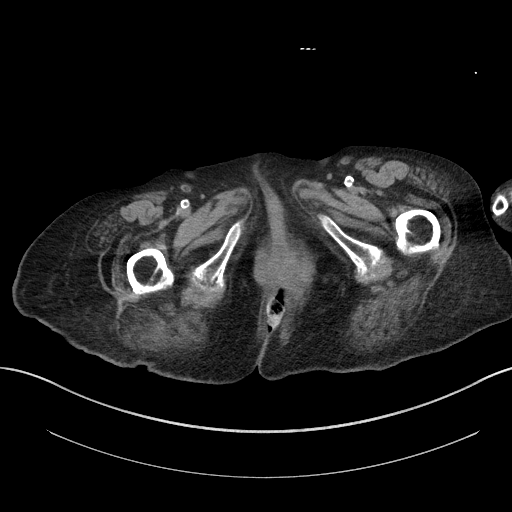
[im 7/84  bone]
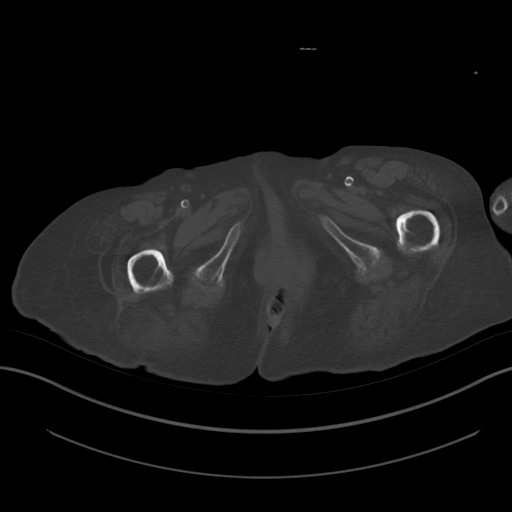
[im 13/84  soft-tissue]
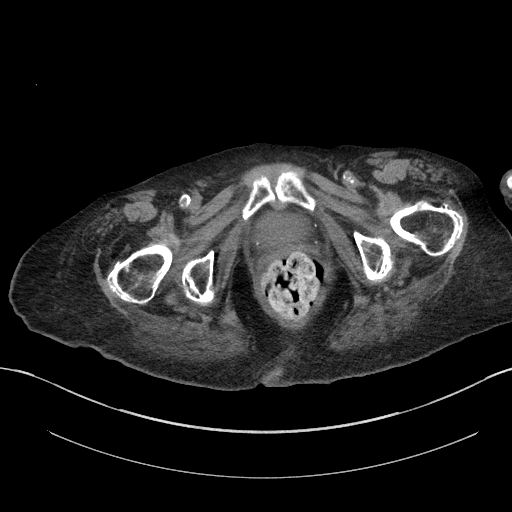
[im 20/84  soft-tissue]
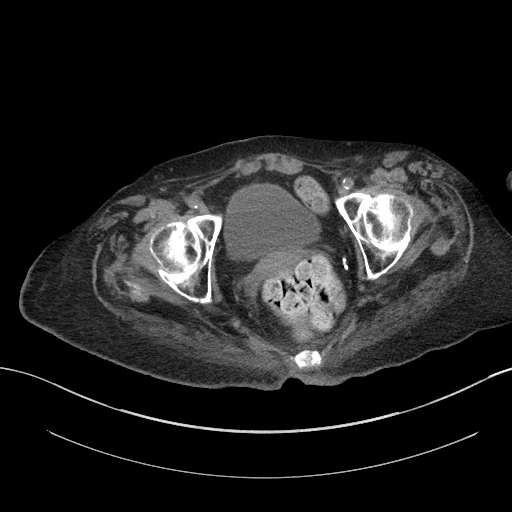
[im 26/84  soft-tissue]
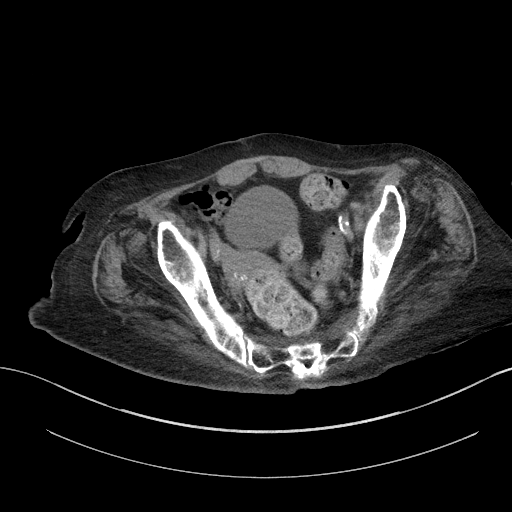
[im 32/84  soft-tissue]
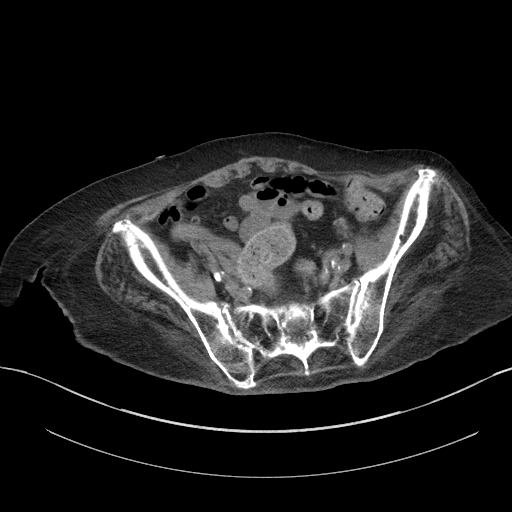
[im 39/84  soft-tissue]
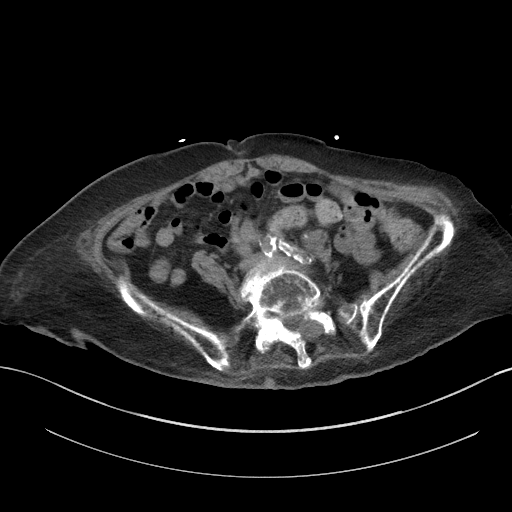
[im 45/84  soft-tissue]
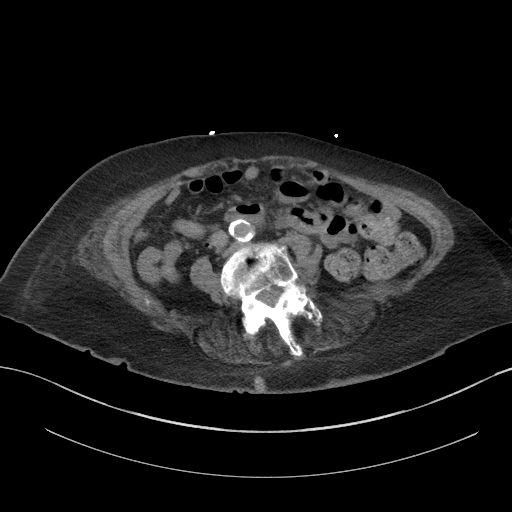
[im 52/84  soft-tissue]
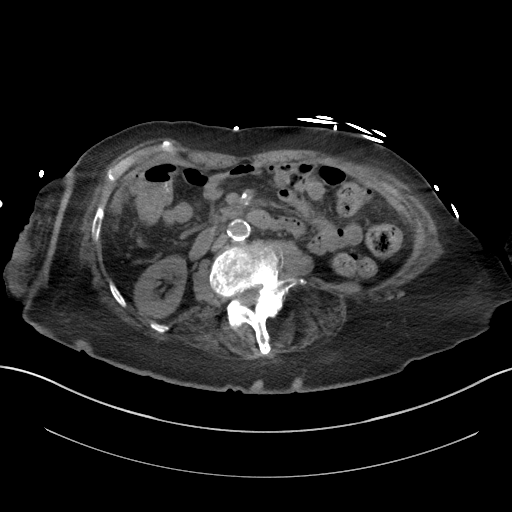
[im 58/84  soft-tissue]
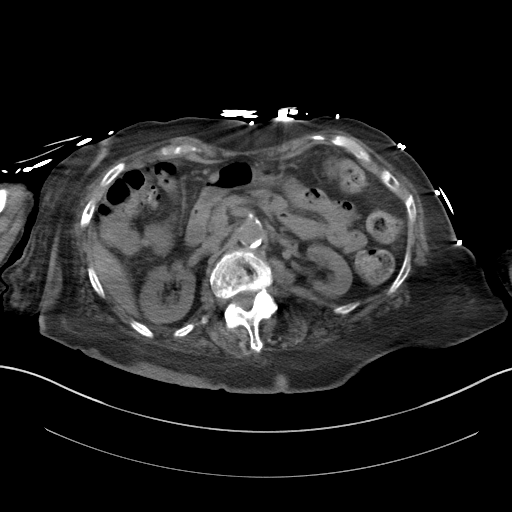
[im 58/84  bone]
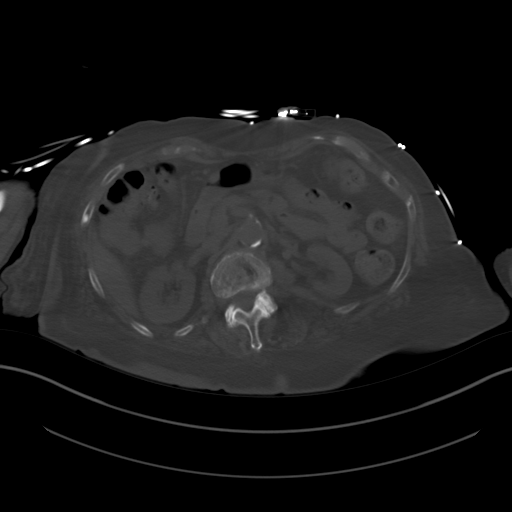
[im 64/84  soft-tissue]
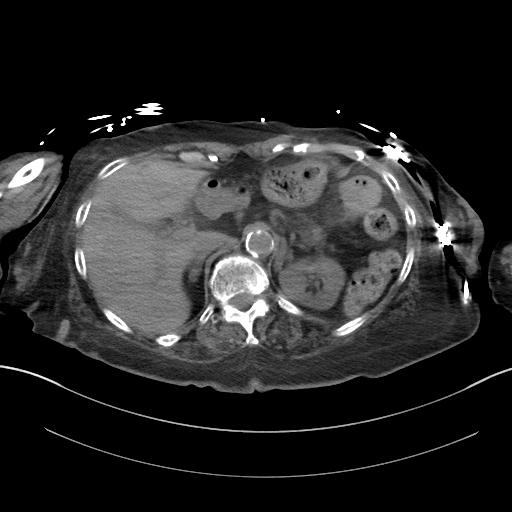
[im 71/84  soft-tissue]
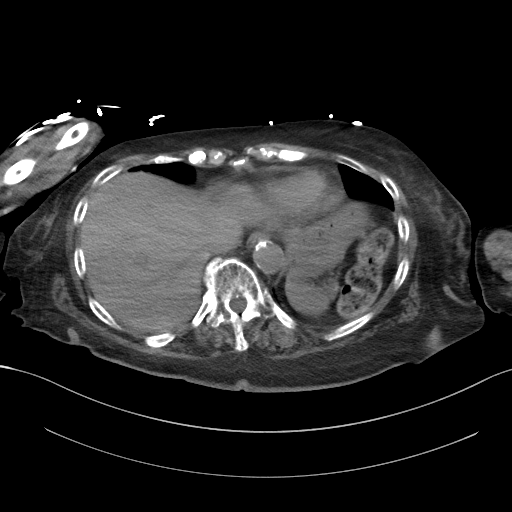
[im 77/84  soft-tissue]
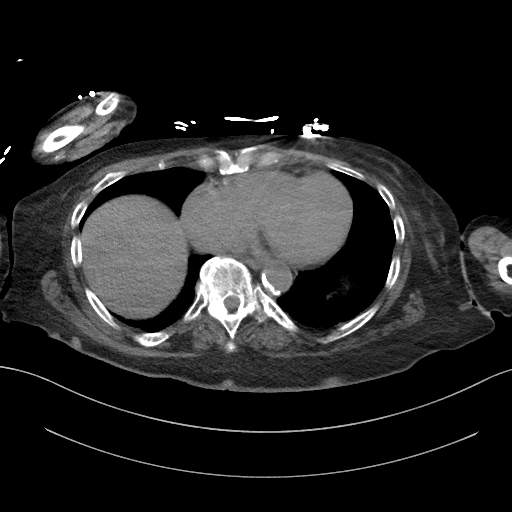

[Series 6: a/p w/o cor · coronal · non-contrast · 0.62mm/px · 3 of 97 slices shown]
[im 33/97  soft-tissue]
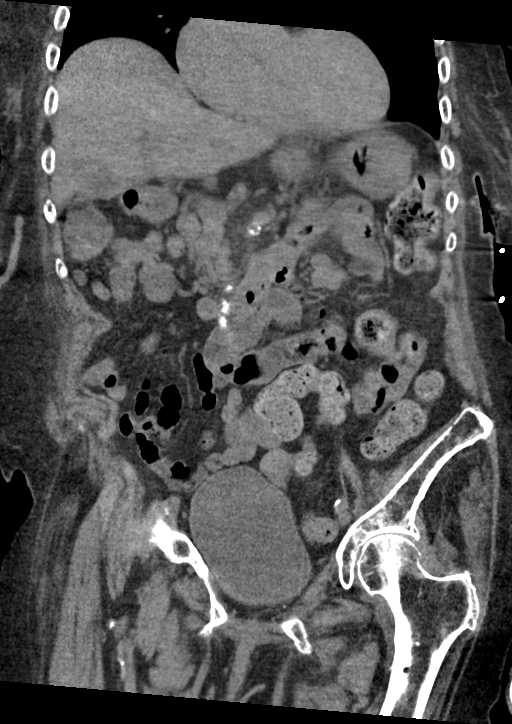
[im 43/97  soft-tissue]
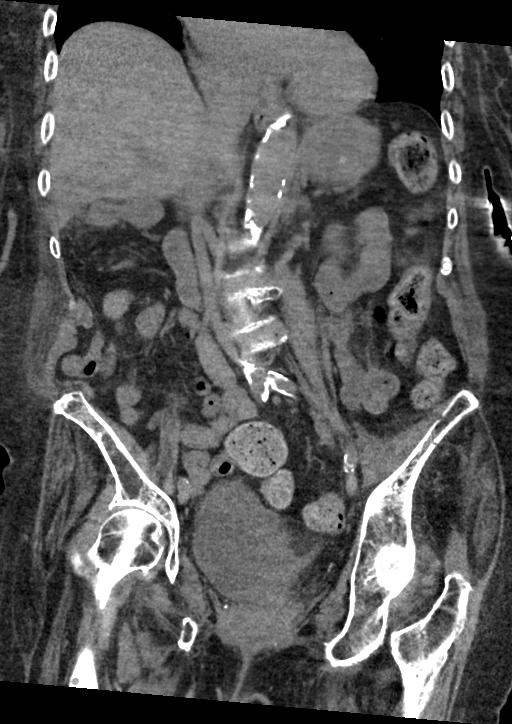
[im 54/97  soft-tissue]
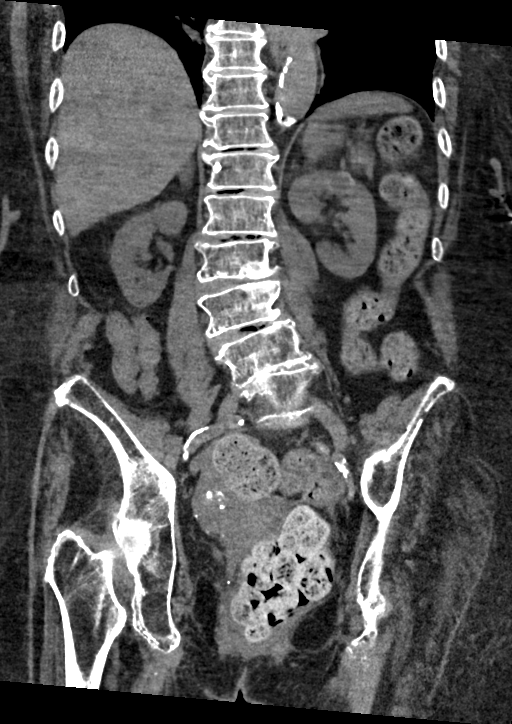

[15 of 46 positions shown; findings below may reference images not displayed]

FINDINGS: Lower chest: No acute abnormality.

Hepatobiliary: No focal liver abnormality is seen. No gallstones,
gallbladder wall thickening, or biliary dilatation.

Pancreas: Unremarkable. No pancreatic ductal dilatation or
surrounding inflammatory changes.

Spleen: Normal in size without focal abnormality.

Adrenals/Urinary Tract: Probable cyst with peripheral calcification
in the upper left kidney. No other renal masses noted. No renal
stones, hydronephrosis, or perinephric stranding. No ureterectasis
or ureteral stones. Calcification in the right lateral bladder
likely tiny stones or debris. The bladder is otherwise normal.

Stomach/Bowel: The distal esophagus and stomach are normal. The
small bowel is unremarkable. Moderate to severe fecal loading seen
in the colon with a moderate stool ball in the rectum measuring 10
by 6.4 by 5.3 cm. No perirectal fat stranding. No pericolonic
stranding. There are a few diverticuli in the right colon without
evidence of diverticulitis. The appendix is normal.

Vascular/Lymphatic: Atherosclerotic changes are seen in the
nonaneurysmal aorta. No adenopathy.

Reproductive: Fibroid in the uterus.  No adnexal mass.

Other: No abdominal wall hernia or abnormality. No abdominopelvic
ascites.

Musculoskeletal: Degenerative changes identified in the spine.
Scoliotic curvature. Grade 1-2 anterolisthesis of L4 versus L5.
IMPRESSION: 1. Diverticulosis in the right colon without diverticulitis. The
appendix is normal.
2. Moderate fecal loading throughout the colon with a moderate-sized
stool ball in the rectum.
3. Probable peripherally calcified cyst in the upper left kidney.
4. No renal stones or obstruction.
5. Probable debris or tiny stones in the bladder.
6. Atherosclerotic change in the nonaneurysmal aorta.
7. Fibroid in the uterus.
8. Significant degenerative changes in the lumbar spine.
